# Patient Record
Sex: Female | Born: 1990 | Race: White | Hispanic: No | Marital: Married | Smoking: Never smoker
Health system: Southern US, Community
[De-identification: ages and names within clinical notes are randomized; demographics above are authoritative.]

## PROBLEM LIST (undated history)

## (undated) DIAGNOSIS — S0230XA Fracture of orbital floor, unspecified side, initial encounter for closed fracture: Secondary | ICD-10-CM

## (undated) DIAGNOSIS — B2790 Infectious mononucleosis, unspecified without complication: Secondary | ICD-10-CM

## (undated) DIAGNOSIS — R011 Cardiac murmur, unspecified: Secondary | ICD-10-CM

## (undated) HISTORY — PX: INNER EAR SURGERY: SHX679

---
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
## 2012-03-17 DIAGNOSIS — S0230XA Fracture of orbital floor, unspecified side, initial encounter for closed fracture: Secondary | ICD-10-CM

## 2012-03-17 HISTORY — DX: Fracture of orbital floor, unspecified side, initial encounter for closed fracture: S02.30XA

## 2014-01-05 ENCOUNTER — Ambulatory Visit (INDEPENDENT_AMBULATORY_CARE_PROVIDER_SITE_OTHER): Payer: BC Managed Care – PPO

## 2014-01-05 VITALS — BP 123/78 | HR 65 | Resp 14 | Ht 67.0 in | Wt 153.0 lb

## 2014-01-05 DIAGNOSIS — S90129A Contusion of unspecified lesser toe(s) without damage to nail, initial encounter: Secondary | ICD-10-CM

## 2014-01-05 DIAGNOSIS — M205X9 Other deformities of toe(s) (acquired), unspecified foot: Secondary | ICD-10-CM

## 2014-01-05 DIAGNOSIS — M2040 Other hammer toe(s) (acquired), unspecified foot: Secondary | ICD-10-CM

## 2014-01-05 NOTE — Progress Notes (Signed)
   Subjective:    Patient ID: Sharon Simon, female    DOB: 1991-01-30, 23 y.o.   MRN: [id number]  HPI Comments: N toe and nail problem L B/L 2nd toes and occasional 3rd toes D couple of years worsening since January 2015 O during soccer runs C toes are contracted and painful, with occasional nail loose A enclosed shoes, exercise T changing shoes  Toe Pain       Review of Systems  All other systems reviewed and are negative.      Objective:   Physical Exam 23 year old white female well-developed well-nourished woman x3 presents at this time with a complaint of recurrent injuries to her second and second and third toes of both feet associated with her soccer playing other activities. The nails sometimes get loose and develop some callus in the distal tuft of the toes. No history of acute injury or trauma. Indicates normal patient wearing shoes are long enough it may be somewhat short. Lower extremity objective findings reveal neurovascular status is intact pedal pulses are palpable epicritic sensation intact mild flexible mallet type deformity the toes are noted no rigid contractures noted x-rays confirm normal contracture without contracture of toes with long second and third toes compared to the hallux. Recommended size your shoes to the second third toes not great toe. Remainder of exam unremarkable orthopedic biomechanical exam unremarkable noncontributory the nails show some slight dystrophy due to history of repeated contusions and likely subungual hematomas allowing the nails to followup at times      Assessment & Plan:  Assessment contusion of toes chronic injury to the sports activities as well as mild mallet toe deformities. Recommended longer shoes proper sizing allow for thumbsin the end of the toe box. Also recommended and dispensed and tubercle pads and recommended an OTC orthotic to help stabilize the foot and maintain the toes in a plantigrade position. Patient does  have some slight promontory changes of the foot note noted clinically and radiographically would benefit from our support attitude or athletic and soccer cleats. Followup in the future on an as-needed basis if symptoms persist or exacerbate. However patient is advised especially with aggressive and competitive sports activities such as playing soccer in college collegiate level it's not unusual develop calluses corns and contusions to the toe on a regular basis. Followup as needed  Sharon Simon DPM

## 2014-01-05 NOTE — Patient Instructions (Signed)
Recommended some over-the-counter orthotics or arch supports such as a super feet which can be obtained at [employer]  WEARING INSTRUCTIONS FOR ORTHOTICS    Don't expect to be comfortable wearing your orthotic devices for the first time.  Like eyeglasses, you may be aware of them as time passes, they will not be uncomfortable and you will enjoy wearing them.  FOLLOW THESE INSTRUCTIONS EXACTLY!  1. Wear your orthotic devices for:       Not more than 1 hour the first day.       Not more than 2 hours the second day.       Not more than 3 hours the third day and so on.        Or wear them for as long as they feel comfortable.       If you experience discomfort in your feet or legs take them out.  When feet & legs feel       better, put them back in.  You do need to be consistent and wear them a little        everyday. 2.   If at any time the orthotic devices become acutely uncomfortable before the       time for that particular day, STOP WEARING THEM. 3.   On the next day, do not increase the wearing time. 4.   Subsequently, increase the wearing time by 15-30 minutes only if comfortable to do       so. 5.   You will be seen by your doctor about 2-4 weeks after you receive your orthotic       devices, at which time you will probably be wearing your devices comfortably        for about 8 hours or more a day. 6.   Some patients occasionally report mild aches or discomfort in other parts of the of       body such as the knees, hips or back after 3 or 4 consecutive hours of wear.  If this       is the case with you, do not extend your wearing time.  Instead, cut it back an hour or       two.  In all likelihood, these symptoms will disappear in a short period of time as your       body posture realigns itself and functions more efficiently. 7.   It is possible that your orthotic device may require some small changes or adjustment       to improve their function or make them more comfortable.    This is usually not done       before one to three months have elapsed.  These adjustments are made in        accordance with the changed position your feet are assuming as a result of       improved biomechanical function. 8.   In women's shoes, it's not unusual for your heel to slip out of the shoe, particularly if       they are step-in-shoes.  If this is the case, try other shoes or other styles.  Try to       purchase shoes which have deeper heal seats or higher heel counters. 9.   Squeaking of orthotics devices in the shoes is due to the movement of the devices       when they are functioning normally.  To eliminate squeaking, simply dust some  baby powder into your shoes before inserting the devices.  If this does not work,        apply soap or wax to the edges of the orthotic devices or put a tissue into the shoes. 10. It is important that you follow these directions explicitly.  Failure to do so will simply       prolong the adjustment period or create problems which are easily avoided.  It makes       no difference if you are wearing your orthotic devices for only a few hours after        several months, so long as you are wearing them comfortably for those hours. 11. If you have any questions or complaints, contact our office.  We have no way of       knowing about your problems unless you tell us.  If we do not hear from you, we will       assume that you are proceeding well.

## 2014-05-15 ENCOUNTER — Emergency Department (HOSPITAL_COMMUNITY)
Admission: EM | Admit: 2014-05-15 | Discharge: 2014-05-15 | Disposition: A | Discharge status: Home / Self Care | Payer: BC Managed Care – PPO | Attending: Emergency Medicine | Admitting: Emergency Medicine

## 2014-05-15 ENCOUNTER — Encounter (HOSPITAL_COMMUNITY): Payer: Self-pay | Admitting: Emergency Medicine

## 2014-05-15 DIAGNOSIS — R531 Weakness: Secondary | ICD-10-CM | POA: Insufficient documentation

## 2014-05-15 DIAGNOSIS — M791 Myalgia: Secondary | ICD-10-CM | POA: Insufficient documentation

## 2014-05-15 DIAGNOSIS — H9209 Otalgia, unspecified ear: Secondary | ICD-10-CM | POA: Insufficient documentation

## 2014-05-15 DIAGNOSIS — Z79899 Other long term (current) drug therapy: Secondary | ICD-10-CM | POA: Insufficient documentation

## 2014-05-15 DIAGNOSIS — Z8619 Personal history of other infectious and parasitic diseases: Secondary | ICD-10-CM | POA: Insufficient documentation

## 2014-05-15 DIAGNOSIS — R011 Cardiac murmur, unspecified: Secondary | ICD-10-CM | POA: Insufficient documentation

## 2014-05-15 DIAGNOSIS — Z792 Long term (current) use of antibiotics: Secondary | ICD-10-CM | POA: Diagnosis not present

## 2014-05-15 DIAGNOSIS — J029 Acute pharyngitis, unspecified: Secondary | ICD-10-CM

## 2014-05-15 HISTORY — DX: Infectious mononucleosis, unspecified without complication: B27.90

## 2014-05-15 HISTORY — DX: Cardiac murmur, unspecified: R01.1

## 2014-05-15 LAB — CBC WITH DIFFERENTIAL/PLATELET
Basophils Absolute: 0.1 10*3/uL (ref 0.0–0.1)
Basophils Relative: 0 % (ref 0–1)
Eosinophils Absolute: 0.1 10*3/uL (ref 0.0–0.7)
Eosinophils Relative: 1 % (ref 0–5)
HCT: 36.2 % (ref 36.0–46.0)
Hemoglobin: 12.5 g/dL (ref 12.0–15.0)
LYMPHS ABS: 2.7 10*3/uL (ref 0.7–4.0)
Lymphocytes Relative: 22 % (ref 12–46)
MCH: 31.3 pg (ref 26.0–34.0)
MCHC: 34.5 g/dL (ref 30.0–36.0)
MCV: 90.7 fL (ref 78.0–100.0)
Monocytes Absolute: 1.3 10*3/uL — ABNORMAL HIGH (ref 0.1–1.0)
Monocytes Relative: 10 % (ref 3–12)
NEUTROS ABS: 8.1 10*3/uL — AB (ref 1.7–7.7)
Neutrophils Relative %: 67 % (ref 43–77)
Platelets: 262 10*3/uL (ref 150–400)
RBC: 3.99 MIL/uL (ref 3.87–5.11)
RDW: 12.5 % (ref 11.5–15.5)
WBC: 12.2 10*3/uL — AB (ref 4.0–10.5)

## 2014-05-15 LAB — I-STAT CHEM 8, ED
BUN: 9 mg/dL (ref 6–23)
CREATININE: 0.8 mg/dL (ref 0.50–1.10)
Calcium, Ion: 1.14 mmol/L (ref 1.12–1.23)
Chloride: 102 mEq/L (ref 96–112)
GLUCOSE: 96 mg/dL (ref 70–99)
HCT: 40 % (ref 36.0–46.0)
HEMOGLOBIN: 13.6 g/dL (ref 12.0–15.0)
POTASSIUM: 3.8 meq/L (ref 3.7–5.3)
Sodium: 138 mEq/L (ref 137–147)
TCO2: 21 mmol/L (ref 0–100)

## 2014-05-15 LAB — MONONUCLEOSIS SCREEN: MONO SCREEN: NEGATIVE

## 2014-05-15 LAB — RAPID STREP SCREEN (MED CTR MEBANE ONLY): STREPTOCOCCUS, GROUP A SCREEN (DIRECT): NEGATIVE

## 2014-05-15 MED ORDER — ONDANSETRON 4 MG PO TBDP: 4.0000 mg | ORAL_TABLET | Freq: Three times a day (TID) | ORAL | Status: DC | PRN

## 2014-05-15 MED ORDER — ONDANSETRON HCL 4 MG/2ML IJ SOLN
4.0000 mg | Freq: Once | INTRAMUSCULAR | Status: AC
  Administered 2014-05-15: 4 mg via INTRAVENOUS
  Filled 2014-05-15: qty 2

## 2014-05-15 MED ORDER — CLINDAMYCIN HCL 300 MG PO CAPS
300.0000 mg | ORAL_CAPSULE | Freq: Once | ORAL | Status: AC
  Administered 2014-05-15: 300 mg via ORAL
  Filled 2014-05-15: qty 1

## 2014-05-15 MED ORDER — SODIUM CHLORIDE 0.9 % IV BOLUS (SEPSIS)
1000.0000 mL | Freq: Once | INTRAVENOUS | Status: AC
  Administered 2014-05-15: 1000 mL via INTRAVENOUS

## 2014-05-15 MED ORDER — CLINDAMYCIN HCL 300 MG PO CAPS: 300.0000 mg | ORAL_CAPSULE | Freq: Three times a day (TID) | ORAL | Status: DC

## 2014-05-15 MED ORDER — HYDROCODONE-ACETAMINOPHEN 5-325 MG PO TABS: 2.0000 | ORAL_TABLET | ORAL | Status: DC | PRN

## 2014-05-15 MED ORDER — DEXAMETHASONE SODIUM PHOSPHATE 10 MG/ML IJ SOLN
10.0000 mg | Freq: Once | INTRAMUSCULAR | Status: AC
  Administered 2014-05-15: 10 mg via INTRAVENOUS
  Filled 2014-05-15: qty 1

## 2014-05-15 MED ORDER — MORPHINE SULFATE 4 MG/ML IJ SOLN
4.0000 mg | Freq: Once | INTRAMUSCULAR | Status: AC
  Administered 2014-05-15: 4 mg via INTRAVENOUS
  Filled 2014-05-15: qty 1

## 2014-05-15 MED ORDER — KETOROLAC TROMETHAMINE 30 MG/ML IJ SOLN
30.0000 mg | Freq: Once | INTRAMUSCULAR | Status: AC
  Administered 2014-05-15: 30 mg via INTRAVENOUS
  Filled 2014-05-15: qty 1

## 2014-05-15 NOTE — ED Provider Notes (Signed)
CSN: [id number]     Arrival date & time 05/15/14  0203 History   First MD Initiated Contact with Patient 05/15/14 0208     Chief Complaint  Patient presents with  . Sore Throat  . Otalgia     (Consider location/radiation/quality/duration/timing/severity/associated sxs/prior Treatment) HPI Comments: Patient finished course of antibiotics for + strep several days ago Since has had increased sore throat, difficulty swallowing fevers.  Was give Hydrocodone by mother with no relief  Has been unable to eat for several days due to pain   Patient is a 23 y.o. female presenting with pharyngitis and ear pain. The history is provided by the patient.  Sore Throat This is a recurrent problem. The current episode started in the past 7 days. The problem occurs constantly. The problem has been gradually worsening. Associated symptoms include headaches, myalgias, a sore throat, swollen glands and weakness. Pertinent negatives include no abdominal pain or fever. The symptoms are aggravated by swallowing. She has tried oral narcotics and NSAIDs for the symptoms. The treatment provided no relief.  Otalgia Associated symptoms: headaches and sore throat   Associated symptoms: no abdominal pain, no ear discharge and no fever     Past Medical History  Diagnosis Date  . Mononucleosis   . Heart murmur    Past Surgical History  Procedure Laterality Date  . Inner ear surgery     No family history on file. History  Substance Use Topics  . Smoking status: Never Smoker   . Smokeless tobacco: Not on file  . Alcohol Use: No   OB History    No data available     Review of Systems  Constitutional: Negative for fever.  HENT: Positive for ear pain, sore throat and trouble swallowing. Negative for drooling and ear discharge.   Eyes: Negative for visual disturbance.  Gastrointestinal: Negative for abdominal pain.  Genitourinary: Negative for dysuria.  Musculoskeletal: Positive for myalgias. Negative for  back pain.  Neurological: Positive for weakness and headaches.  All other systems reviewed and are negative.     Allergies  Review of patient's allergies indicates no known allergies.  Home Medications   Prior to Admission medications   Medication Sig Start Date End Date Taking? Authorizing Provider  acetaminophen (TYLENOL) 500 MG tablet Take 1,000 mg by mouth every 6 (six) hours as needed (for pain.).   Yes Historical Provider, MD  ibuprofen (ADVIL,MOTRIN) 200 MG tablet Take 400-600 mg by mouth every 6 (six) hours as needed (for pain).   Yes Historical Provider, MD  lidocaine (XYLOCAINE) 2 % solution Use as directed 10-15 mLs in the mouth or throat every 4 (four) hours as needed. Gargle with to help with pain. 04/30/14  Yes Historical Provider, MD  MONO-LINYAH 0.25-35 MG-MCG tablet 1 tablet. 05/11/14  Yes Historical Provider, MD  Omega-3 Fatty Acids (FISH OIL) 1000 MG CAPS Take 1 capsule by mouth daily.    Yes Historical Provider, MD  Probiotic Product (PROBIOTIC PO) Take 1 tablet by mouth daily.    Yes Historical Provider, MD  valACYclovir (VALTREX) 1000 MG tablet Take 500 mg by mouth daily. 04/07/14  Yes Historical Provider, MD  clindamycin (CLEOCIN) 300 MG capsule Take 1 capsule (300 mg total) by mouth 3 (three) times daily. 05/15/14   Arman FilterGail K Audiel Scheiber, NP  HYDROcodone-acetaminophen (NORCO/VICODIN) 5-325 MG per tablet Take 2 tablets by mouth every 4 (four) hours as needed. 05/15/14   Arman FilterGail K Caleen Taaffe, NP  ondansetron (ZOFRAN ODT) 4 MG disintegrating tablet Take 1 tablet (  4 mg total) by mouth every 8 (eight) hours as needed for nausea or vomiting. 05/15/14   Arman FilterGail K Koty Anctil, NP   BP 140/89 mmHg  Pulse 86  Temp(Src) 98.4 F (36.9 C) (Oral)  Resp 18  Ht 5\' 7"  (1.702 m)  Wt 150 lb (68.04 kg)  BMI 23.49 kg/m2  SpO2 100%  LMP 05/10/2014 Physical Exam  Constitutional: She appears well-developed and well-nourished.  HENT:  Head: Normocephalic.  Right Ear: External ear normal.  Left Ear:  External ear normal.  Mouth/Throat: Oropharynx is clear and moist.  Eyes: Pupils are equal, round, and reactive to light.  Neck: Normal range of motion.  Cardiovascular: Normal rate.   Pulmonary/Chest: Effort normal.  Abdominal: Soft.  Musculoskeletal: Normal range of motion.  Lymphadenopathy:    She has no cervical adenopathy.  Neurological: She is alert.  Skin: Skin is warm and dry. No rash noted.  Nursing note and vitals reviewed.   ED Course  Procedures (including critical care time) Labs Review Labs Reviewed  CBC WITH DIFFERENTIAL - Abnormal; Notable for the following:    WBC 12.2 (*)    Neutro Abs 8.1 (*)    Monocytes Absolute 1.3 (*)    All other components within normal limits  RAPID STREP SCREEN  CULTURE, GROUP A STREP  MONONUCLEOSIS SCREEN  I-STAT CHEM 8, ED    Imaging Review No results found.   EKG Interpretation None      MDM  Discussed with patient and family treatment plan of IV decadron here than PO Clindamycin and Zofran  for home use   Has appointment with PCP on Monday  Final diagnoses:  Pharyngitis         Arman FilterGail K Earlena Werst, NP 05/15/14 0242  Arman FilterGail K Annasophia Crocker, NP 05/15/14 [id number]  Arman FilterGail K Maeve Debord, NP 05/15/14 0510  Loren Raceravid Yelverton, MD 05/16/14 [phone]

## 2014-05-15 NOTE — ED Notes (Signed)
Pt presents with L ear pain, sore throat. Pt states she completed 10 regiment of PCN after testing positive for strep, returned to min clinic neg for strep but now feels worse, difficulty eating, painful swallowing, red faced.

## 2014-05-15 NOTE — Discharge Instructions (Signed)
Today blood work showed a slight increase in WBC to 12.8, negative strep and Mono spot  You were given IV fuids, IV Decadron Zofran , Morphin e and Toradol for symptom relief You have been sent home with prescriptions for Zofran and Clindamycin   Please take these as directed Keep follow up appointment with your PCP on Monday Try to stay hydrated, eat a high protein diet get plenty of rest

## 2014-05-17 LAB — CULTURE, GROUP A STREP

## 2014-06-03 HISTORY — PX: TONSILLECTOMY: SHX5217

## 2014-06-28 ENCOUNTER — Encounter (HOSPITAL_COMMUNITY): Payer: Self-pay | Admitting: Emergency Medicine

## 2014-06-28 ENCOUNTER — Emergency Department (HOSPITAL_COMMUNITY)
Admission: EM | Admit: 2014-06-28 | Discharge: 2014-06-28 | Disposition: A | Discharge status: Home / Self Care | Payer: BLUE CROSS/BLUE SHIELD | Attending: Emergency Medicine | Admitting: Emergency Medicine

## 2014-06-28 DIAGNOSIS — Z79899 Other long term (current) drug therapy: Secondary | ICD-10-CM | POA: Diagnosis not present

## 2014-06-28 DIAGNOSIS — Z792 Long term (current) use of antibiotics: Secondary | ICD-10-CM | POA: Diagnosis not present

## 2014-06-28 DIAGNOSIS — R011 Cardiac murmur, unspecified: Secondary | ICD-10-CM | POA: Insufficient documentation

## 2014-06-28 DIAGNOSIS — R042 Hemoptysis: Secondary | ICD-10-CM | POA: Diagnosis present

## 2014-06-28 DIAGNOSIS — Z8619 Personal history of other infectious and parasitic diseases: Secondary | ICD-10-CM | POA: Diagnosis not present

## 2014-06-28 DIAGNOSIS — J95830 Postprocedural hemorrhage and hematoma of a respiratory system organ or structure following a respiratory system procedure: Secondary | ICD-10-CM | POA: Diagnosis not present

## 2014-06-28 DIAGNOSIS — Y838 Other surgical procedures as the cause of abnormal reaction of the patient, or of later complication, without mention of misadventure at the time of the procedure: Secondary | ICD-10-CM | POA: Insufficient documentation

## 2014-06-28 DIAGNOSIS — J358 Other chronic diseases of tonsils and adenoids: Secondary | ICD-10-CM

## 2014-06-28 NOTE — ED Notes (Signed)
Pt had tonsillectomy on Thursday, spitting up blood, family states she spat up about 3-4 tablespoons of blood, was told to come in for more than 2 tablespoons of blood. Family states patient appears pale. Patient appears to be in control of secretions.

## 2014-06-28 NOTE — ED Provider Notes (Signed)
CSN: [id number]     Arrival date & time 06/28/14  0707 History   First MD Initiated Contact with Patient 06/28/14 [phone]     No chief complaint on file.    (Consider location/radiation/quality/duration/timing/severity/associated sxs/prior Treatment) HPI Comments: Patient here complaining of spitting up blood characterized as bright red approximately 3 tablespoons. Patient had tonsillectomy 5 days ago. Bleeding has since subsided. She has had some trouble swallowing but can control secretions. Has been using her pain medications properly. Did not use any treatment prior to arrival. Nothing made her symptoms better or worse.  The history is provided by the patient and a parent.    Past Medical History  Diagnosis Date  . Mononucleosis   . Heart murmur    Past Surgical History  Procedure Laterality Date  . Inner ear surgery     History reviewed. No pertinent family history. History  Substance Use Topics  . Smoking status: Never Smoker   . Smokeless tobacco: Not on file  . Alcohol Use: No   OB History    No data available     Review of Systems  All other systems reviewed and are negative.     Allergies  Review of patient's allergies indicates no known allergies.  Home Medications   Prior to Admission medications   Medication Sig Start Date End Date Taking? Authorizing Provider  acetaminophen (TYLENOL) 500 MG tablet Take 1,000 mg by mouth every 6 (six) hours as needed (for pain.).    Historical Provider, MD  clindamycin (CLEOCIN) 300 MG capsule Take 1 capsule (300 mg total) by mouth 3 (three) times daily. 05/15/14   Arman FilterGail K Schulz, NP  HYDROcodone-acetaminophen (NORCO/VICODIN) 5-325 MG per tablet Take 2 tablets by mouth every 4 (four) hours as needed. 05/15/14   Arman FilterGail K Schulz, NP  ibuprofen (ADVIL,MOTRIN) 200 MG tablet Take 400-600 mg by mouth every 6 (six) hours as needed (for pain).    Historical Provider, MD  lidocaine (XYLOCAINE) 2 % solution Use as directed 10-15 mLs in  the mouth or throat every 4 (four) hours as needed. Gargle with to help with pain. 04/30/14   Historical Provider, MD  MONO-LINYAH 0.25-35 MG-MCG tablet 1 tablet. 05/11/14   Historical Provider, MD  Omega-3 Fatty Acids (FISH OIL) 1000 MG CAPS Take 1 capsule by mouth daily.     Historical Provider, MD  ondansetron (ZOFRAN ODT) 4 MG disintegrating tablet Take 1 tablet (4 mg total) by mouth every 8 (eight) hours as needed for nausea or vomiting. 05/15/14   Arman FilterGail K Schulz, NP  Probiotic Product (PROBIOTIC PO) Take 1 tablet by mouth daily.     Historical Provider, MD  valACYclovir (VALTREX) 1000 MG tablet Take 500 mg by mouth daily. 04/07/14   Historical Provider, MD   BP 102/64 mmHg  Pulse 79  Resp 18  SpO2 100% Physical Exam  Constitutional: She is oriented to person, place, and time. She appears well-developed and well-nourished.  Non-toxic appearance. No distress.  HENT:  Head: Normocephalic and atraumatic.  Mouth/Throat:    Eyes: Conjunctivae, EOM and lids are normal. Pupils are equal, round, and reactive to light.  Neck: Normal range of motion. Neck supple. No tracheal deviation present. No thyroid mass present.  Cardiovascular: Normal rate, regular rhythm and normal heart sounds.  Exam reveals no gallop.   No murmur heard. Pulmonary/Chest: Effort normal and breath sounds normal. No stridor. No respiratory distress. She has no decreased breath sounds. She has no wheezes. She has no rhonchi. She has  no rales.  Abdominal: Soft. Normal appearance and bowel sounds are normal. She exhibits no distension. There is no tenderness. There is no rebound and no CVA tenderness.  Musculoskeletal: Normal range of motion. She exhibits no edema or tenderness.  Neurological: She is alert and oriented to person, place, and time. She has normal strength. No cranial nerve deficit or sensory deficit. GCS eye subscore is 4. GCS verbal subscore is 5. GCS motor subscore is 6.  Skin: Skin is warm and dry. No abrasion  and no rash noted.  Psychiatric: She has a normal mood and affect. Her speech is normal and behavior is normal.  Nursing note and vitals reviewed.   ED Course  Procedures (including critical care time) Labs Review Labs Reviewed - No data to display  Imaging Review No results found.   EKG Interpretation None      MDM   Final diagnoses:  Tonsillar bleed   patient offered IV fluids here in she has deferred. No active bleeding noted at this time. Patient to follow-up with her ENT doctor    Toy Baker, MD 06/28/14 [phone]

## 2014-06-28 NOTE — Discharge Instructions (Signed)
Follow-up with your ENT doctor today. Return here for any persistent bleeding

## 2017-04-03 ENCOUNTER — Ambulatory Visit (INDEPENDENT_AMBULATORY_CARE_PROVIDER_SITE_OTHER): Payer: BLUE CROSS/BLUE SHIELD

## 2017-04-03 ENCOUNTER — Encounter (INDEPENDENT_AMBULATORY_CARE_PROVIDER_SITE_OTHER): Payer: Self-pay | Admitting: Surgery

## 2017-04-03 ENCOUNTER — Ambulatory Visit (INDEPENDENT_AMBULATORY_CARE_PROVIDER_SITE_OTHER): Payer: BLUE CROSS/BLUE SHIELD | Admitting: Surgery

## 2017-04-03 VITALS — BP 122/78 | HR 73 | Ht 66.0 in | Wt 152.0 lb

## 2017-04-03 DIAGNOSIS — M25551 Pain in right hip: Secondary | ICD-10-CM | POA: Diagnosis not present

## 2017-04-03 DIAGNOSIS — M533 Sacrococcygeal disorders, not elsewhere classified: Secondary | ICD-10-CM

## 2017-04-03 DIAGNOSIS — M545 Low back pain: Secondary | ICD-10-CM | POA: Diagnosis not present

## 2017-04-03 DIAGNOSIS — G8929 Other chronic pain: Secondary | ICD-10-CM

## 2017-04-03 MED ORDER — METHYLPREDNISOLONE 4 MG PO TABS: ORAL_TABLET | ORAL | 0 refills | Status: DC

## 2017-04-03 NOTE — Progress Notes (Signed)
 Office Visit Note   Patient: Sharon Simon           Date of Birth: 02/05/1991           MRN: [id number] Visit Date: 04/03/2017              Requested by: No referring provider defined for this encounter. PCP: Patient, No Pcp Per   Assessment & Plan: Visit Diagnoses:  1. Right low back pain, unspecified chronicity, with sciatica presence unspecified   2. Chronic right SI joint pain   3. Lateral pain of right hip     Plan: Patient was given prescription for Medrol Dosepak 6 day taper to be taken as directed see if this helps with all areas addressed today. She did appear to have some degenerative changes of the right SI joint and with the symptoms that she has been having over the last 3 months I do recommend getting blood work to check CBC and arthritis panel. She'll also go to a couple of visits of formal PT for lumbar protocol and also to be given home exercises to include hamstring and IT band stretching. Follow-up in a couple weeks for recheck and will review labs at that time. We'll make further decision on imaging studies depending upon her symptoms. All questions answered.  Follow-Up Instructions: Return in about 2 weeks (around 04/17/2017) for With .   Orders:  Orders Placed This Encounter  Procedures  . XR Lumbar Spine 2-3 Views  . XR Pelvis 1-2 Views  . CBC  . Rheumatoid Factor  . Antinuclear Antib (ANA)  . Uric acid  . Sed Rate (ESR)   Meds ordered this encounter  Medications  . DISCONTD: methylPREDNISolone (MEDROL) 4 MG tablet    Sig: 6 day taper to be taken as directed    Dispense:  21 tablet    Refill:  0  . methylPREDNISolone (MEDROL) 4 MG tablet    Sig: 6 day taper to be taken as directed    Dispense:  21 tablet    Refill:  0      Procedures: No procedures performed   Clinical Data: No additional findings.   Subjective: Chief Complaint  Patient presents with  . Lower Back - Pain    HPI Referral white female is new patient the  office comes in with complaints of right-sided low back and buttock pain. States that pain as been off and on 3 months but worse over the last day or so. No specific injury that she can recall. 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 Nothing on the left side. No complaints of radiating pain down the right leg or numbness and tingling. Discomfort aggravated with ambulation, getting up from a seated position, bending. Better with standing still or lying flat. Does have some lateral hip pain and lying on her right side. Has used ibuprofen and Biofreeze without any improvement. No previous problems for onset. No other specific joint pain. No personal history of inflammatory arthropathy.  Review of Systems No current cardiopulmonary GI GU issues.  Objective: Vital Signs: BP 122/78 (BP Location: Left Arm, Patient Position: Sitting)   Pulse 73   Ht 5' 6" (1.676 m)   Wt 152 lb (68.9 kg)   BMI 24.53 kg/m   Physical Exam  Constitutional: She is oriented to person, place, and time. She appears well-developed and well-nourished. No distress.  HENT:  Head:   Normocephalic and atraumatic.  Eyes: Pupils are equal, round, and reactive to light. EOM are normal.  Neck: Normal range of motion.  Pulmonary/Chest: No respiratory distress.  Abdominal: She exhibits no distension.  Musculoskeletal:  Exam heel and toe gait normal. She does have some right-sided low back pain with lumbar extension. Lumbar flexion does cause discomfort right low back. No lumbar paraspinal tenderness. She is mild to moderately tender over the right SI joint. Negative on the left side. No sciatic notch tenderness. Negative straight leg raise the patient does have tight bilateral hamstrings. Bilateral hamstrings tendon nontender. Mildly tender over the right hip greater trochanter bursa. Negative on the left side. Mild to moderately positive FABER test  on the right. Negative on the left side. Bilateral calves nontender. Neurovascularly intact. No focal motor deficits.  Neurological: She is alert and oriented to person, place, and time.  Skin: Skin is warm.  Psychiatric: She has a normal mood and affect.    Ortho Exam  Specialty Comments:  No specialty comments available.  Imaging: Xr Lumbar Spine 2-3 Views  Result Date: 04/03/2017 X-rays lumbar spine show disc spaces to be well-maintained. No listhesis. No acute finding.  Xr Pelvis 1-2 Views  Result Date: 04/03/2017 AP pelvis and bilateral SI joints show question of mild to moderate degenerative changes of the lower right SI joint. No acute finding.    PMFS History: There are no active problems to display for this patient.  Past Medical History:  Diagnosis Date  . Heart murmur   . Mononucleosis     No family history on file.  Past Surgical History:  Procedure Laterality Date  . INNER EAR SURGERY    . TONSILLECTOMY  06/2014   Social History   Occupational History  . Not on file.   Social History Main Topics  . Smoking status: Never Smoker  . Smokeless tobacco: Never Used  . Alcohol use No  . Drug use: No  . Sexual activity: Not on file

## 2017-04-04 LAB — CBC
HEMATOCRIT: 40.8 % (ref 35.0–45.0)
HEMOGLOBIN: 14 g/dL (ref 11.7–15.5)
MCH: 31.7 pg (ref 27.0–33.0)
MCHC: 34.3 g/dL (ref 32.0–36.0)
MCV: 92.5 fL (ref 80.0–100.0)
MPV: 11.3 fL (ref 7.5–12.5)
Platelets: 297 10*3/uL (ref 140–400)
RBC: 4.41 10*6/uL (ref 3.80–5.10)
RDW: 12 % (ref 11.0–15.0)
WBC: 6.7 10*3/uL (ref 3.8–10.8)

## 2017-04-04 LAB — ANA: ANA: NEGATIVE

## 2017-04-04 LAB — SEDIMENTATION RATE: SED RATE: 6 mm/h (ref 0–20)

## 2017-04-04 LAB — URIC ACID: URIC ACID, SERUM: 3.6 mg/dL (ref 2.5–7.0)

## 2017-04-04 LAB — RHEUMATOID FACTOR: Rhuematoid fact SerPl-aCnc: <14 IU/mL (ref <14)

## 2017-04-16 ENCOUNTER — Ambulatory Visit (INDEPENDENT_AMBULATORY_CARE_PROVIDER_SITE_OTHER): Payer: BLUE CROSS/BLUE SHIELD | Admitting: Surgery

## 2017-04-23 ENCOUNTER — Encounter (INDEPENDENT_AMBULATORY_CARE_PROVIDER_SITE_OTHER): Payer: Self-pay | Admitting: Surgery

## 2017-04-23 ENCOUNTER — Ambulatory Visit (INDEPENDENT_AMBULATORY_CARE_PROVIDER_SITE_OTHER): Payer: BLUE CROSS/BLUE SHIELD | Admitting: Surgery

## 2017-04-23 DIAGNOSIS — G8929 Other chronic pain: Secondary | ICD-10-CM | POA: Diagnosis not present

## 2017-04-23 DIAGNOSIS — M533 Sacrococcygeal disorders, not elsewhere classified: Secondary | ICD-10-CM | POA: Diagnosis not present

## 2017-04-23 NOTE — Progress Notes (Addendum)
   Office Visit Note   Patient: Sharon Simon           Date of Birth: Feb 26, 1991           MRN: [id number] Visit Date: 04/23/2017              Requested by: No referring provider defined for this encounter. PCP: Patient, No Pcp Per   Assessment & Plan: Visit Diagnoses:  1. Chronic right SI joint pain     Plan: Patient doing much better after medication. Advised to avoid activities that may inflame the SI joint. Follow-up the office as needed. If pain returns we may consider trying a diagnostic/therapeutic SI joint injection with Dr. Alvester MorinNewton. All questions answered.  Follow-Up Instructions: Return if symptoms worsen or fail to improve.   Orders:  No orders of the defined types were placed in this encounter.  No orders of the defined types were placed in this encounter.     Procedures: No procedures performed   Clinical Data: No additional findings.   Subjective: Chief Complaint  Patient presents with  . Lower Back - Follow-up    HPI Patient returns. Pain is gone after taking the prednisone taper. Doing well at this point.  Last office visit blood work was done to check CBC and arthritis panel and all of these were within normal limits. Review of Systems No cardiac pulmonary GI GU issues  Objective: Vital Signs: There were no vitals taken for this visit.  Physical Exam  Constitutional: She is oriented to person, place, and time. She appears well-developed. No distress.  HENT:  Head: Normocephalic and atraumatic.  Eyes: EOM are normal. Pupils are equal, round, and reactive to light.  Pulmonary/Chest: No respiratory distress.  Musculoskeletal:  Gait is normal. No pain with lumbar flexion/extension. Nontender over the bilateral SI joints. Negative logroll bilateral hips.  Neurological: She is alert and oriented to person, place, and time.    Ortho Exam  Specialty Comments:  No specialty comments available.  Imaging: No results found.   PMFS  History: There are no active problems to display for this patient.  Past Medical History:  Diagnosis Date  . Heart murmur   . Mononucleosis     History reviewed. No pertinent family history.  Past Surgical History:  Procedure Laterality Date  . INNER EAR SURGERY    . TONSILLECTOMY  06/2014   Social History   Occupational History  . Not on file  Tobacco Use  . Smoking status: Never Smoker  . Smokeless tobacco: Never Used  Substance and Sexual Activity  . Alcohol use: No  . Drug use: No  . Sexual activity: Not on file

## 2018-09-13 ENCOUNTER — Ambulatory Visit (HOSPITAL_COMMUNITY)
Admission: EM | Admit: 2018-09-13 | Discharge: 2018-09-14 | Disposition: A | Discharge status: Home / Self Care | Payer: BLUE CROSS/BLUE SHIELD | Attending: Surgery | Admitting: Surgery

## 2018-09-13 ENCOUNTER — Other Ambulatory Visit: Payer: Self-pay

## 2018-09-13 ENCOUNTER — Emergency Department (HOSPITAL_COMMUNITY): Payer: BLUE CROSS/BLUE SHIELD | Admitting: Certified Registered Nurse Anesthetist

## 2018-09-13 ENCOUNTER — Encounter (HOSPITAL_COMMUNITY): Payer: Self-pay | Admitting: Emergency Medicine

## 2018-09-13 ENCOUNTER — Emergency Department (HOSPITAL_COMMUNITY): Payer: BLUE CROSS/BLUE SHIELD

## 2018-09-13 ENCOUNTER — Encounter (HOSPITAL_COMMUNITY)
Admission: EM | Disposition: A | Discharge status: Home / Self Care | Payer: Self-pay | Source: Home / Self Care | Attending: Emergency Medicine

## 2018-09-13 DIAGNOSIS — K3580 Unspecified acute appendicitis: Secondary | ICD-10-CM | POA: Diagnosis present

## 2018-09-13 DIAGNOSIS — Z79899 Other long term (current) drug therapy: Secondary | ICD-10-CM | POA: Diagnosis not present

## 2018-09-13 DIAGNOSIS — Z881 Allergy status to other antibiotic agents status: Secondary | ICD-10-CM | POA: Insufficient documentation

## 2018-09-13 DIAGNOSIS — K381 Appendicular concretions: Secondary | ICD-10-CM | POA: Diagnosis not present

## 2018-09-13 HISTORY — PX: LAPAROSCOPIC APPENDECTOMY: SHX408

## 2018-09-13 HISTORY — DX: Fracture of orbital floor, unspecified side, initial encounter for closed fracture: S02.30XA

## 2018-09-13 LAB — COMPREHENSIVE METABOLIC PANEL
ALT: 12 U/L (ref 0–44)
AST: 21 U/L (ref 15–41)
Albumin: 4 g/dL (ref 3.5–5.0)
Alkaline Phosphatase: 44 U/L (ref 38–126)
Anion gap: 10 (ref 5–15)
BUN: 12 mg/dL (ref 6–20)
CO2: 19 mmol/L — ABNORMAL LOW (ref 22–32)
Calcium: 8.8 mg/dL — ABNORMAL LOW (ref 8.9–10.3)
Chloride: 105 mmol/L (ref 98–111)
Creatinine, Ser: 0.77 mg/dL (ref 0.44–1.00)
GFR calc Af Amer: >60 mL/min (ref >60)
GFR calc non Af Amer: >60 mL/min (ref >60)
Glucose, Bld: 124 mg/dL — ABNORMAL HIGH (ref 70–99)
Potassium: 3.8 mmol/L (ref 3.5–5.1)
Sodium: 134 mmol/L — ABNORMAL LOW (ref 135–145)
Total Bilirubin: 0.4 mg/dL (ref 0.3–1.2)
Total Protein: 7.1 g/dL (ref 6.5–8.1)

## 2018-09-13 LAB — CBC WITH DIFFERENTIAL/PLATELET
Abs Immature Granulocytes: 0.02 10*3/uL (ref 0.00–0.07)
Basophils Absolute: 0.1 10*3/uL (ref 0.0–0.1)
Basophils Relative: 1 %
Eosinophils Absolute: 0 10*3/uL (ref 0.0–0.5)
Eosinophils Relative: 0 %
HCT: 38.7 % (ref 36.0–46.0)
Hemoglobin: 12.9 g/dL (ref 12.0–15.0)
Immature Granulocytes: 0 %
Lymphocytes Relative: 16 %
Lymphs Abs: 1.5 10*3/uL (ref 0.7–4.0)
MCH: 31.2 pg (ref 26.0–34.0)
MCHC: 33.3 g/dL (ref 30.0–36.0)
MCV: 93.7 fL (ref 80.0–100.0)
Monocytes Absolute: 0.5 10*3/uL (ref 0.1–1.0)
Monocytes Relative: 5 %
Neutro Abs: 7.8 10*3/uL — ABNORMAL HIGH (ref 1.7–7.7)
Neutrophils Relative %: 78 %
Platelets: 240 10*3/uL (ref 150–400)
RBC: 4.13 MIL/uL (ref 3.87–5.11)
RDW: 12 % (ref 11.5–15.5)
WBC: 9.9 10*3/uL (ref 4.0–10.5)
nRBC: 0 % (ref 0.0–0.2)

## 2018-09-13 LAB — URINALYSIS, ROUTINE W REFLEX MICROSCOPIC
Bilirubin Urine: NEGATIVE
Glucose, UA: NEGATIVE mg/dL
Hgb urine dipstick: NEGATIVE
Ketones, ur: 80 mg/dL — AB
Leukocytes,Ua: NEGATIVE
Nitrite: NEGATIVE
Protein, ur: NEGATIVE mg/dL
Specific Gravity, Urine: >1.046 — ABNORMAL HIGH (ref 1.005–1.030)
pH: 5 (ref 5.0–8.0)

## 2018-09-13 LAB — I-STAT BETA HCG BLOOD, ED (MC, WL, AP ONLY): I-stat hCG, quantitative: <5 m[IU]/mL (ref <5)

## 2018-09-13 LAB — LIPASE, BLOOD: Lipase: 27 U/L (ref 11–51)

## 2018-09-13 SURGERY — APPENDECTOMY, LAPAROSCOPIC
Anesthesia: General | Site: Abdomen

## 2018-09-13 MED ORDER — HYDROMORPHONE HCL 1 MG/ML IJ SOLN
INTRAMUSCULAR | Status: AC
  Filled 2018-09-13: qty 1

## 2018-09-13 MED ORDER — SUGAMMADEX SODIUM 200 MG/2ML IV SOLN
INTRAVENOUS | Status: DC | PRN
  Administered 2018-09-13: 175 mg via INTRAVENOUS

## 2018-09-13 MED ORDER — GABAPENTIN 300 MG PO CAPS: 300.0000 mg | ORAL_CAPSULE | ORAL | Status: DC

## 2018-09-13 MED ORDER — LIDOCAINE 2% (20 MG/ML) 5 ML SYRINGE
INTRAMUSCULAR | Status: AC
  Filled 2018-09-13: qty 5

## 2018-09-13 MED ORDER — KETOROLAC TROMETHAMINE 30 MG/ML IJ SOLN: 30.0000 mg | Freq: Once | INTRAMUSCULAR | Status: DC | PRN

## 2018-09-13 MED ORDER — FENTANYL CITRATE (PF) 100 MCG/2ML IJ SOLN
INTRAMUSCULAR | Status: DC | PRN
  Administered 2018-09-13: 50 ug via INTRAVENOUS
  Administered 2018-09-13: 100 ug via INTRAVENOUS
  Administered 2018-09-13 (×2): 50 ug via INTRAVENOUS

## 2018-09-13 MED ORDER — MAGIC MOUTHWASH
15.0000 mL | Freq: Four times a day (QID) | ORAL | Status: DC | PRN
  Filled 2018-09-13: qty 15

## 2018-09-13 MED ORDER — HYDROMORPHONE HCL 1 MG/ML IJ SOLN: 0.5000 mg | INTRAMUSCULAR | Status: DC | PRN

## 2018-09-13 MED ORDER — PROCHLORPERAZINE EDISYLATE 10 MG/2ML IJ SOLN: 5.0000 mg | Freq: Four times a day (QID) | INTRAMUSCULAR | Status: DC | PRN

## 2018-09-13 MED ORDER — METRONIDAZOLE IN NACL 5-0.79 MG/ML-% IV SOLN
500.0000 mg | Freq: Three times a day (TID) | INTRAVENOUS | Status: DC
  Administered 2018-09-14: 02:00:00 500 mg via INTRAVENOUS
  Filled 2018-09-13 (×2): qty 100

## 2018-09-13 MED ORDER — SIMETHICONE 80 MG PO CHEW
40.0000 mg | CHEWABLE_TABLET | Freq: Four times a day (QID) | ORAL | Status: DC | PRN
  Administered 2018-09-13: 40 mg via ORAL
  Filled 2018-09-13: qty 1

## 2018-09-13 MED ORDER — SCOPOLAMINE 1 MG/3DAYS TD PT72
MEDICATED_PATCH | TRANSDERMAL | Status: DC | PRN
  Administered 2018-09-13: 1 via TRANSDERMAL

## 2018-09-13 MED ORDER — ONDANSETRON HCL 4 MG/2ML IJ SOLN
INTRAMUSCULAR | Status: AC
  Filled 2018-09-13: qty 2

## 2018-09-13 MED ORDER — TRAMADOL HCL 50 MG PO TABS
50.0000 mg | ORAL_TABLET | Freq: Four times a day (QID) | ORAL | Status: DC | PRN
  Administered 2018-09-14: 50 mg via ORAL
  Filled 2018-09-13: qty 1

## 2018-09-13 MED ORDER — CHLORHEXIDINE GLUCONATE CLOTH 2 % EX PADS: 6.0000 | MEDICATED_PAD | Freq: Once | CUTANEOUS | Status: DC

## 2018-09-13 MED ORDER — ACETAMINOPHEN 500 MG PO TABS: 1000.0000 mg | ORAL_TABLET | ORAL | Status: DC

## 2018-09-13 MED ORDER — METRONIDAZOLE IN NACL 5-0.79 MG/ML-% IV SOLN
500.0000 mg | Freq: Once | INTRAVENOUS | Status: AC
  Administered 2018-09-13: 500 mg via INTRAVENOUS
  Filled 2018-09-13: qty 100

## 2018-09-13 MED ORDER — METOPROLOL TARTRATE 5 MG/5ML IV SOLN: 5.0000 mg | Freq: Four times a day (QID) | INTRAVENOUS | Status: DC | PRN

## 2018-09-13 MED ORDER — BUPIVACAINE LIPOSOME 1.3 % IJ SUSP
INTRAMUSCULAR | Status: DC | PRN
  Administered 2018-09-13: 20 mL

## 2018-09-13 MED ORDER — SUCCINYLCHOLINE CHLORIDE 200 MG/10ML IV SOSY
PREFILLED_SYRINGE | INTRAVENOUS | Status: AC
  Filled 2018-09-13: qty 10

## 2018-09-13 MED ORDER — SODIUM CHLORIDE 0.9 % IV SOLN: 2.0000 g | INTRAVENOUS | Status: DC

## 2018-09-13 MED ORDER — DIPHENHYDRAMINE HCL 50 MG/ML IJ SOLN: 12.5000 mg | Freq: Four times a day (QID) | INTRAMUSCULAR | Status: DC | PRN

## 2018-09-13 MED ORDER — SODIUM CHLORIDE 0.9% FLUSH: 3.0000 mL | INTRAVENOUS | Status: DC | PRN

## 2018-09-13 MED ORDER — LIP MEDEX EX OINT
1.0000 "application " | TOPICAL_OINTMENT | Freq: Two times a day (BID) | CUTANEOUS | Status: DC
  Administered 2018-09-13: 1 via TOPICAL
  Filled 2018-09-13: qty 7

## 2018-09-13 MED ORDER — SODIUM CHLORIDE 0.9 % IV SOLN: 250.0000 mL | INTRAVENOUS | Status: DC | PRN

## 2018-09-13 MED ORDER — HYDROMORPHONE HCL 1 MG/ML IJ SOLN
0.2500 mg | INTRAMUSCULAR | Status: DC | PRN
  Administered 2018-09-13: 0.25 mg via INTRAVENOUS
  Administered 2018-09-13: 20:00:00 0.5 mg via INTRAVENOUS

## 2018-09-13 MED ORDER — SUCCINYLCHOLINE CHLORIDE 200 MG/10ML IV SOSY
PREFILLED_SYRINGE | INTRAVENOUS | Status: DC | PRN
  Administered 2018-09-13: 120 mg via INTRAVENOUS

## 2018-09-13 MED ORDER — HYDRALAZINE HCL 20 MG/ML IJ SOLN: 5.0000 mg | INTRAMUSCULAR | Status: DC | PRN

## 2018-09-13 MED ORDER — MIDAZOLAM HCL 2 MG/2ML IJ SOLN
INTRAMUSCULAR | Status: AC
  Filled 2018-09-13: qty 2

## 2018-09-13 MED ORDER — IOHEXOL 300 MG/ML  SOLN
100.0000 mL | Freq: Once | INTRAMUSCULAR | Status: AC | PRN
  Administered 2018-09-13: 16:00:00 100 mL via INTRAVENOUS

## 2018-09-13 MED ORDER — LACTATED RINGERS IV BOLUS: 1000.0000 mL | Freq: Three times a day (TID) | INTRAVENOUS | Status: DC | PRN

## 2018-09-13 MED ORDER — NAPROXEN 500 MG PO TABS: 500.0000 mg | ORAL_TABLET | Freq: Two times a day (BID) | ORAL | 1 refills | Status: AC | PRN

## 2018-09-13 MED ORDER — LACTATED RINGERS IV SOLN: 1000.0000 mL | Freq: Three times a day (TID) | INTRAVENOUS | Status: DC | PRN

## 2018-09-13 MED ORDER — DEXAMETHASONE SODIUM PHOSPHATE 10 MG/ML IJ SOLN
INTRAMUSCULAR | Status: AC
  Filled 2018-09-13: qty 1

## 2018-09-13 MED ORDER — MIDAZOLAM HCL 5 MG/5ML IJ SOLN
INTRAMUSCULAR | Status: DC | PRN
  Administered 2018-09-13: 2 mg via INTRAVENOUS

## 2018-09-13 MED ORDER — BISACODYL 10 MG RE SUPP: 10.0000 mg | Freq: Every day | RECTAL | Status: DC | PRN

## 2018-09-13 MED ORDER — DEXAMETHASONE SODIUM PHOSPHATE 10 MG/ML IJ SOLN
INTRAMUSCULAR | Status: DC | PRN
  Administered 2018-09-13: 10 mg via INTRAVENOUS

## 2018-09-13 MED ORDER — BUPIVACAINE LIPOSOME 1.3 % IJ SUSP
20.0000 mL | INTRAMUSCULAR | Status: AC
  Filled 2018-09-13: qty 20

## 2018-09-13 MED ORDER — ENOXAPARIN SODIUM 40 MG/0.4ML ~~LOC~~ SOLN
40.0000 mg | SUBCUTANEOUS | Status: DC
  Administered 2018-09-14: 40 mg via SUBCUTANEOUS
  Filled 2018-09-13: qty 0.4

## 2018-09-13 MED ORDER — ONDANSETRON HCL 4 MG/2ML IJ SOLN
INTRAMUSCULAR | Status: DC | PRN
  Administered 2018-09-13: 4 mg via INTRAVENOUS

## 2018-09-13 MED ORDER — ROCURONIUM BROMIDE 10 MG/ML (PF) SYRINGE
PREFILLED_SYRINGE | INTRAVENOUS | Status: DC | PRN
  Administered 2018-09-13: 5 mg via INTRAVENOUS
  Administered 2018-09-13: 35 mg via INTRAVENOUS

## 2018-09-13 MED ORDER — BUPIVACAINE-EPINEPHRINE (PF) 0.25% -1:200000 IJ SOLN
INTRAMUSCULAR | Status: AC
  Filled 2018-09-13: qty 30

## 2018-09-13 MED ORDER — LIDOCAINE 2% (20 MG/ML) 5 ML SYRINGE
INTRAMUSCULAR | Status: DC | PRN
  Administered 2018-09-13: 100 mg via INTRAVENOUS

## 2018-09-13 MED ORDER — SODIUM CHLORIDE 0.9% FLUSH: 3.0000 mL | Freq: Two times a day (BID) | INTRAVENOUS | Status: DC

## 2018-09-13 MED ORDER — DIPHENHYDRAMINE HCL 12.5 MG/5ML PO ELIX: 12.5000 mg | ORAL_SOLUTION | Freq: Four times a day (QID) | ORAL | Status: DC | PRN

## 2018-09-13 MED ORDER — MEPERIDINE HCL 50 MG/ML IJ SOLN: 6.2500 mg | INTRAMUSCULAR | Status: DC | PRN

## 2018-09-13 MED ORDER — SODIUM CHLORIDE 0.9 % IV SOLN
2.0000 g | Freq: Once | INTRAVENOUS | Status: AC
  Administered 2018-09-13: 17:00:00 2 g via INTRAVENOUS
  Filled 2018-09-13: qty 20

## 2018-09-13 MED ORDER — SODIUM CHLORIDE 0.9 % IV SOLN
INTRAVENOUS | Status: DC
  Administered 2018-09-13 (×2): via INTRAVENOUS

## 2018-09-13 MED ORDER — BUPIVACAINE-EPINEPHRINE 0.25% -1:200000 IJ SOLN
INTRAMUSCULAR | Status: DC | PRN
  Administered 2018-09-13: 30 mL

## 2018-09-13 MED ORDER — 0.9 % SODIUM CHLORIDE (POUR BTL) OPTIME
TOPICAL | Status: DC | PRN
  Administered 2018-09-13: 1000 mL

## 2018-09-13 MED ORDER — ROCURONIUM BROMIDE 10 MG/ML (PF) SYRINGE
PREFILLED_SYRINGE | INTRAVENOUS | Status: AC
  Filled 2018-09-13: qty 10

## 2018-09-13 MED ORDER — HYDROMORPHONE HCL 1 MG/ML IJ SOLN
1.0000 mg | Freq: Once | INTRAMUSCULAR | Status: AC
  Administered 2018-09-13: 16:00:00 1 mg via INTRAVENOUS
  Filled 2018-09-13: qty 1

## 2018-09-13 MED ORDER — ONDANSETRON HCL 4 MG/2ML IJ SOLN: 4.0000 mg | Freq: Four times a day (QID) | INTRAMUSCULAR | Status: DC | PRN

## 2018-09-13 MED ORDER — FENTANYL CITRATE (PF) 250 MCG/5ML IJ SOLN
INTRAMUSCULAR | Status: AC
  Filled 2018-09-13: qty 5

## 2018-09-13 MED ORDER — PROMETHAZINE HCL 25 MG/ML IJ SOLN: 6.2500 mg | INTRAMUSCULAR | Status: DC | PRN

## 2018-09-13 MED ORDER — POLYETHYLENE GLYCOL 3350 17 G PO PACK: 17.0000 g | PACK | Freq: Every day | ORAL | Status: DC | PRN

## 2018-09-13 MED ORDER — ONDANSETRON 4 MG PO TBDP: 4.0000 mg | ORAL_TABLET | Freq: Four times a day (QID) | ORAL | Status: DC | PRN

## 2018-09-13 MED ORDER — ONDANSETRON HCL 4 MG/2ML IJ SOLN
4.0000 mg | Freq: Once | INTRAMUSCULAR | Status: AC
  Administered 2018-09-13: 15:00:00 4 mg via INTRAVENOUS
  Filled 2018-09-13: qty 2

## 2018-09-13 MED ORDER — ACETAMINOPHEN 500 MG PO TABS
1000.0000 mg | ORAL_TABLET | Freq: Three times a day (TID) | ORAL | Status: DC
  Administered 2018-09-13 – 2018-09-14 (×2): 1000 mg via ORAL
  Filled 2018-09-13 (×2): qty 2

## 2018-09-13 MED ORDER — ZOLPIDEM TARTRATE 5 MG PO TABS: 5.0000 mg | ORAL_TABLET | Freq: Every evening | ORAL | Status: DC | PRN

## 2018-09-13 MED ORDER — NAPROXEN 500 MG PO TABS: 500.0000 mg | ORAL_TABLET | Freq: Two times a day (BID) | ORAL | Status: DC | PRN

## 2018-09-13 MED ORDER — SODIUM CHLORIDE 0.9 % IV SOLN
INTRAVENOUS | Status: DC
  Administered 2018-09-13: 23:00:00 via INTRAVENOUS

## 2018-09-13 MED ORDER — TRAMADOL HCL 50 MG PO TABS: 50.0000 mg | ORAL_TABLET | Freq: Four times a day (QID) | ORAL | 0 refills | Status: AC | PRN

## 2018-09-13 MED ORDER — PROPOFOL 10 MG/ML IV BOLUS
INTRAVENOUS | Status: AC
  Filled 2018-09-13: qty 20

## 2018-09-13 MED ORDER — PROPOFOL 10 MG/ML IV BOLUS
INTRAVENOUS | Status: DC | PRN
  Administered 2018-09-13: 180 mg via INTRAVENOUS

## 2018-09-13 MED ORDER — PROCHLORPERAZINE MALEATE 10 MG PO TABS: 10.0000 mg | ORAL_TABLET | Freq: Four times a day (QID) | ORAL | Status: DC | PRN

## 2018-09-13 MED ORDER — MORPHINE SULFATE (PF) 4 MG/ML IV SOLN
4.0000 mg | Freq: Once | INTRAVENOUS | Status: AC
  Administered 2018-09-13: 15:00:00 4 mg via INTRAVENOUS
  Filled 2018-09-13: qty 1

## 2018-09-13 MED ORDER — METRONIDAZOLE IN NACL 5-0.79 MG/ML-% IV SOLN: 500.0000 mg | INTRAVENOUS | Status: DC

## 2018-09-13 MED ORDER — SODIUM CHLORIDE (PF) 0.9 % IJ SOLN
INTRAMUSCULAR | Status: AC
  Filled 2018-09-13: qty 50

## 2018-09-13 MED ORDER — OXYCODONE HCL 5 MG PO TABS
5.0000 mg | ORAL_TABLET | ORAL | Status: DC | PRN
  Administered 2018-09-13: 23:00:00 10 mg via ORAL
  Filled 2018-09-13: qty 2

## 2018-09-13 MED ORDER — GABAPENTIN 300 MG PO CAPS
300.0000 mg | ORAL_CAPSULE | Freq: Two times a day (BID) | ORAL | Status: DC
  Administered 2018-09-13 – 2018-09-14 (×2): 300 mg via ORAL
  Filled 2018-09-13 (×2): qty 1

## 2018-09-13 MED ORDER — SCOPOLAMINE 1 MG/3DAYS TD PT72
MEDICATED_PATCH | TRANSDERMAL | Status: AC
  Filled 2018-09-13: qty 1

## 2018-09-13 MED ORDER — METHOCARBAMOL 500 MG PO TABS: 750.0000 mg | ORAL_TABLET | Freq: Four times a day (QID) | ORAL | Status: DC | PRN

## 2018-09-13 SURGICAL SUPPLY — 49 items
APL PRP STRL LF DISP 70% ISPRP (MISCELLANEOUS) ×1
APPLIER CLIP 5 13 M/L LIGAMAX5 (MISCELLANEOUS)
APPLIER CLIP ROT 10 11.4 M/L (STAPLE)
APR CLP MED LRG 11.4X10 (STAPLE)
APR CLP MED LRG 5 ANG JAW (MISCELLANEOUS)
BAG SPEC RTRVL LRG 6X4 10 (ENDOMECHANICALS) ×1
CABLE HIGH FREQUENCY MONO STRZ (ELECTRODE) ×2 IMPLANT
CHLORAPREP W/TINT 26 (MISCELLANEOUS) ×2 IMPLANT
CLIP APPLIE 5 13 M/L LIGAMAX5 (MISCELLANEOUS) IMPLANT
CLIP APPLIE ROT 10 11.4 M/L (STAPLE) IMPLANT
COVER SURGICAL LIGHT HANDLE (MISCELLANEOUS) ×2 IMPLANT
COVER WAND RF STERILE (DRAPES) IMPLANT
CUTTER FLEX LINEAR 45M (STAPLE) ×2 IMPLANT
DECANTER SPIKE VIAL GLASS SM (MISCELLANEOUS) ×2 IMPLANT
DEVICE TROCAR PUNCTURE CLOSURE (ENDOMECHANICALS) IMPLANT
DRAPE LAPAROSCOPIC ABDOMINAL (DRAPES) ×2 IMPLANT
DRAPE WARM FLUID 44X44 (DRAPE) ×2 IMPLANT
DRSG TEGADERM 2-3/8X2-3/4 SM (GAUZE/BANDAGES/DRESSINGS) ×4 IMPLANT
DRSG TEGADERM 4X4.75 (GAUZE/BANDAGES/DRESSINGS) ×2 IMPLANT
ELECT REM PT RETURN 15FT ADLT (MISCELLANEOUS) ×2 IMPLANT
ENDOLOOP SUT PDS II  0 18 (SUTURE) ×1
ENDOLOOP SUT PDS II 0 18 (SUTURE) IMPLANT
GAUZE SPONGE 2X2 8PLY STRL LF (GAUZE/BANDAGES/DRESSINGS) ×1 IMPLANT
GLOVE ECLIPSE 8.0 STRL XLNG CF (GLOVE) ×2 IMPLANT
GLOVE INDICATOR 8.0 STRL GRN (GLOVE) ×2 IMPLANT
GOWN STRL REUS W/TWL XL LVL3 (GOWN DISPOSABLE) ×4 IMPLANT
IRRIG SUCT STRYKERFLOW 2 WTIP (MISCELLANEOUS) ×2
IRRIGATION SUCT STRKRFLW 2 WTP (MISCELLANEOUS) ×1 IMPLANT
KIT BASIN OR (CUSTOM PROCEDURE TRAY) ×2 IMPLANT
KIT TURNOVER KIT A (KITS) IMPLANT
PAD POSITIONING PINK XL (MISCELLANEOUS) ×2 IMPLANT
POUCH SPECIMEN RETRIEVAL 10MM (ENDOMECHANICALS) ×2 IMPLANT
RELOAD 45 VASCULAR/THIN (ENDOMECHANICALS) IMPLANT
RELOAD STAPLE 45 3.5 BLU ETS (ENDOMECHANICALS) IMPLANT
RELOAD STAPLE TA45 3.5 REG BLU (ENDOMECHANICALS) ×2 IMPLANT
SCISSORS LAP 5X35 DISP (ENDOMECHANICALS) ×2 IMPLANT
SET TUBE SMOKE EVAC HIGH FLOW (TUBING) ×2 IMPLANT
SHEARS HARMONIC ACE PLUS 36CM (ENDOMECHANICALS) IMPLANT
SLEEVE XCEL OPT CAN 5 100 (ENDOMECHANICALS) ×2 IMPLANT
SPONGE GAUZE 2X2 STER 10/PKG (GAUZE/BANDAGES/DRESSINGS) ×1
SUT MNCRL AB 4-0 PS2 18 (SUTURE) ×2 IMPLANT
SUT PDS AB 0 CT1 36 (SUTURE) IMPLANT
SUT PDS AB 1 CT1 27 (SUTURE) IMPLANT
SUT SILK 2 0 SH (SUTURE) IMPLANT
TOWEL OR 17X26 10 PK STRL BLUE (TOWEL DISPOSABLE) ×2 IMPLANT
TRAY FOLEY MTR SLVR 16FR STAT (SET/KITS/TRAYS/PACK) IMPLANT
TRAY LAPAROSCOPIC (CUSTOM PROCEDURE TRAY) ×2 IMPLANT
TROCAR BLADELESS OPT 5 100 (ENDOMECHANICALS) ×2 IMPLANT
TROCAR XCEL 12X100 BLDLESS (ENDOMECHANICALS) ×2 IMPLANT

## 2018-09-13 NOTE — H&P (Signed)
Sharon SimmondsLauren N Simon  10-12-1990 [id number]  CARE TEAM:  PCP: Marcelle OverlieGrewal, Michelle, MD  Outpatient Care Team: Patient Care Team: Marcelle OverlieGrewal, Michelle, MD as PCP - General (Obstetrics and Gynecology)  Inpatient Treatment Team: Treatment Team: Attending Provider: Laurence SpatesLittle, Rachel Morgan, MD; Registered Nurse: Debby FreibergWedderburn, Ngozi N, RN; Technician: Louellen MolderLowdermilk, Jessica N, NT; Technician: Reva BoresLittle, Shirlene M, NT; Consulting Physician: Montez Moritacs, Md, MD   This patient is a 28 y.o.female who presents today for surgical evaluation at the request of Dr Clarene DukeLittle.   Chief complaint / Reason for evaluation: Appendicitis  Young healthy woman who awoke in the middle night with crampy abdominal pain.  Periumbilical.  Gradually intensified through the day.  Decreased appetite.  Nauseated.  Did not progress to vomiting. Did not improve with pain medications or antiacid medications.  Based on concerns brought to emergency room.  No sick contacts or travel.  No history of inflammatory bowel disease or irritable bowel syndrome.  No vaginal bleeding or discharge.  No history of STDs or PID.  No urinary tract infections.  No hematuria or dysuria or pyuria.  No history of kidney stones.  CT scan shows evidence of probable early appendicitis with fecalith.  Surgical consultation requested.  No personal nor family history of GI/colon cancer, inflammatory bowel disease, irritable bowel syndrome, allergy such as Celiac Sprue, dietary/dairy problems, colitis, ulcers nor gastritis.  No recent sick contacts/gastroenteritis.  No travel outside the country.  No changes in diet.  No dysphagia to solids or liquids.  No significant heartburn or reflux.  No hematochezia, hematemesis, coffee ground emesis.  No evidence of prior gastric/peptic ulceration.  He does not smoke.  Normally can walk several miles not any difficulty.  Symptoms of better control but did require IV pain and nausea medications.   Assessment  Sharon SimmondsLauren N Simon  28 y.o. female   Day of Surgery  Procedure(s): APPENDECTOMY LAPAROSCOPIC  Problem List:  Principal Problem:   Acute appendicitis   History physical and CT scan suspicious for early appendicitis.  The rest of the differential diagnosis is unremarkable   Plan:  Diagnostic laparoscopy with appendectomy.  The anatomy & physiology of the digestive tract was discussed.  The pathophysiology of appendicitis and other appendiceal disorders were discussed.  Natural history risks without surgery was discussed.   I feel the risks of no intervention will lead to serious problems that outweigh the operative risks; therefore, I recommended diagnostic laparoscopy with removal of appendix to remove the pathology.  Laparoscopic & open techniques were discussed.   I noted a good likelihood this will help address the problem.    Risks such as bleeding, infection, abscess, leak, reoperation, injury to other organs, need for repair of tissues / organs, possible ostomy, hernia, heart attack, stroke, death, and other risks were discussed.  Goals of post-operative recovery were discussed as well.  We will work to minimize complications.  Questions were answered.  The patient expresses understanding & wishes to proceed with surgery.   IV fluids.  IV antibiotics.  Nausea control.  Pain control.  -VTE prophylaxis- SCDs, etc -mobilize as tolerated to help recovery  25 minutes spent in review, evaluation, examination, counseling, and coordination of care.  More than 50% of that time was spent in counseling.  Ardeth SportsmanSteven C. Jasmina Gendron, MD, FACS, MASCRS Gastrointestinal and Minimally Invasive Surgery    [address] [place], [place] [id number] [phone] Main / Paging [phone] Fax   09/13/2018      Past Medical  History:  Diagnosis Date  . Heart murmur   . Mononucleosis   . Orbital floor fracture (HCC) 03/17/2012    Past Surgical History:  Procedure Laterality Date  . INNER EAR SURGERY    .  TONSILLECTOMY  06/2014    Social History   Socioeconomic History  . Marital status: Married    Spouse name: Not on file  . Number of children: Not on file  . Years of education: Not on file  . Highest education level: Not on file  Occupational History  . Not on file  Social Needs  . Financial resource strain: Not on file  . Food insecurity:    Worry: Not on file    Inability: Not on file  . Transportation needs:    Medical: Not on file    Non-medical: Not on file  Tobacco Use  . Smoking status: Never Smoker  . Smokeless tobacco: Never Used  Substance and Sexual Activity  . Alcohol use: No  . Drug use: No  . Sexual activity: Yes    Birth control/protection: Pill  Lifestyle  . Physical activity:    Days per week: Not on file    Minutes per session: Not on file  . Stress: Not on file  Relationships  . Social connections:    Talks on phone: Not on file    Gets together: Not on file    Attends religious service: Not on file    Active member of club or organization: Not on file    Attends meetings of clubs or organizations: Not on file    Relationship status: Not on file  . Intimate partner violence:    Fear of current or ex partner: Not on file    Emotionally abused: Not on file    Physically abused: Not on file    Forced sexual activity: Not on file  Other Topics Concern  . Not on file  Social History Narrative  . Not on file    History reviewed. No pertinent family history.  Current Facility-Administered Medications  Medication Dose Route Frequency Provider Last Rate Last Dose  . 0.9 %  sodium chloride infusion   Intravenous Continuous Lorre Nick, MD 125 mL/hr at 09/13/18 1447    . metroNIDAZOLE (FLAGYL) IVPB 500 mg  500 mg Intravenous Once Little, Ambrose Finland, MD 100 mL/hr at 09/13/18 1805 500 mg at 09/13/18 1805  . sodium chloride (PF) 0.9 % injection            Current Outpatient Medications  Medication Sig Dispense Refill  . BLACK ELDERBERRY PO  Take 2 tablets by mouth daily.    Marland Kitchen MONO-LINYAH 0.25-35 MG-MCG tablet Take 1 tablet by mouth every evening.   1  . methylPREDNISolone (MEDROL) 4 MG tablet 6 day taper to be taken as directed (Patient not taking: Reported on 04/23/2017) 21 tablet 0  . valACYclovir (VALTREX) 1000 MG tablet Take 500 mg by mouth daily as needed (cold sores).   0     No Known Allergies  ROS:   All other systems reviewed & are negative except per HPI or as noted below: Constitutional:  No fevers, chills, sweats.  Weight stable Eyes:  No vision changes, No discharge HENT:  No sore throats, nasal drainage Lymph: No neck swelling, No bruising easily Pulmonary:  No cough, productive sputum CV: No orthopnea, PND  Patient walks 60 minutes for about 2 miles without difficulty.  No exertional chest/neck/shoulder/arm pain. GI: No personal nor family history of GI/colon  cancer, inflammatory bowel disease, irritable bowel syndrome, allergy such as Celiac Sprue, dietary/dairy problems, colitis, ulcers nor gastritis.  No recent sick contacts/gastroenteritis.  No travel outside the country.  No changes in diet. Renal: No UTIs, No hematuria Genital:  No drainage, bleeding, masses Musculoskeletal: No severe joint pain.  Good ROM major joints Skin:  No sores or lesions.  No rashes Heme/Lymph:  No easy bleeding.  No swollen lymph nodes Neuro: No focal weakness/numbness.  No seizures Psych: No suicidal ideation.  No hallucinations  BP 115/73 (BP Location: Right Arm)   Pulse 60   Temp 97.7 F (36.5 C) (Oral)   Resp 15   LMP 08/02/2018   SpO2 100%   Physical Exam: General: Pt awake/alert/oriented x4 in no major acute distress Eyes: PERRL, normal EOM. Sclera nonicteric Neuro: CN II-XII intact w/o focal sensory/motor deficits. Lymph: No head/neck/groin lymphadenopathy Psych:  No delerium/psychosis/paranoia HENT: Normocephalic, Mucus membranes moist.  No thrush Neck: Supple, No tracheal deviation Chest: No pain.  Good  respiratory excursion. CV:  Pulses intact.  Regular rhythm Abdomen: Soft, Nondistended.  Mild tenderness in lower abdomen.  No obvious guarding.  Upper abdomen nontender.  Mild diastases recti.   Gen:  No inguinal hernias.  No inguinal lymphadenopathy.  No obvious vaginal bleeding or discharge Ext:  SCDs BLE.  No significant edema.  No cyanosis Skin: No petechiae / purpurea.  No major sores Musculoskeletal: No severe joint pain.  Good ROM major joints   Results:   Labs: Results for orders placed or performed during the hospital encounter of 09/13/18 (from the past 48 hour(s))  CBC with Differential/Platelet     Status: Abnormal   Collection Time: 09/13/18  2:32 PM  Result Value Ref Range   WBC 9.9 4.0 - 10.5 K/uL   RBC 4.13 3.87 - 5.11 MIL/uL   Hemoglobin 12.9 12.0 - 15.0 g/dL   HCT 16.1 09.6 - 04.5 %   MCV 93.7 80.0 - 100.0 fL   MCH 31.2 26.0 - 34.0 pg   MCHC 33.3 30.0 - 36.0 g/dL   RDW 40.9 81.1 - 91.4 %   Platelets 240 150 - 400 K/uL   nRBC 0.0 0.0 - 0.2 %   Neutrophils Relative % 78 %   Neutro Abs 7.8 (H) 1.7 - 7.7 K/uL   Lymphocytes Relative 16 %   Lymphs Abs 1.5 0.7 - 4.0 K/uL   Monocytes Relative 5 %   Monocytes Absolute 0.5 0.1 - 1.0 K/uL   Eosinophils Relative 0 %   Eosinophils Absolute 0.0 0.0 - 0.5 K/uL   Basophils Relative 1 %   Basophils Absolute 0.1 0.0 - 0.1 K/uL   Immature Granulocytes 0 %   Abs Immature Granulocytes 0.02 0.00 - 0.07 K/uL    Comment: Performed at [facility], [address], [place], [place] [address]  Comprehensive metabolic panel     Status: Abnormal   Collection Time: 09/13/18  2:32 PM  Result Value Ref Range   Sodium 134 (L) 135 - 145 mmol/L   Potassium 3.8 3.5 - 5.1 mmol/L   Chloride 105 98 - 111 mmol/L   CO2 19 (L) 22 - 32 mmol/L   Glucose, Bld 124 (H) 70 - 99 mg/dL   BUN 12 6 - 20 mg/dL   Creatinine, Ser 6.21 0.44 - 1.00 mg/dL   Calcium 8.8 (L) 8.9 - 10.3 mg/dL   Total Protein 7.1 6.5 - 8.1 g/dL   Albumin  4.0 3.5 - 5.0 g/dL  AST 21 15 - 41 U/L   ALT 12 0 - 44 U/L   Alkaline Phosphatase 44 38 - 126 U/L   Total Bilirubin 0.4 0.3 - 1.2 mg/dL   GFR calc non Af Amer >60 >60 mL/min   GFR calc Af Amer >60 >60 mL/min   Anion gap 10 5 - 15    Comment: Performed at [facility], [address] [place], [place] [address]  Lipase, blood     Status: None   Collection Time: 09/13/18  2:32 PM  Result Value Ref Range   Lipase 27 11 - 51 U/L    Comment: Performed at [facility], [address], [place], [place] [address]  I-Stat beta hCG blood, ED ([facility], [facility], [facility] only)     Status: None   Collection Time: 09/13/18  3:04 PM  Result Value Ref Range   I-stat hCG, quantitative <5.0 <5 mIU/mL   Comment 3            Comment:   GEST. AGE      CONC.  (mIU/mL)   <=1 WEEK        5 - 50     2 WEEKS       50 - 500     3 WEEKS       100 - 10,000     4 WEEKS     1,000 - 30,000        FEMALE AND NON-PREGNANT FEMALE:     LESS THAN 5 mIU/mL   Urinalysis, Routine w reflex microscopic     Status: Abnormal   Collection Time: 09/13/18  3:40 PM  Result Value Ref Range   Color, Urine STRAW (A) YELLOW   APPearance CLEAR CLEAR   Specific Gravity, Urine >1.046 (H) 1.005 - 1.030   pH 5.0 5.0 - 8.0   Glucose, UA NEGATIVE NEGATIVE mg/dL   Hgb urine dipstick NEGATIVE NEGATIVE   Bilirubin Urine NEGATIVE NEGATIVE   Ketones, ur 80 (A) NEGATIVE mg/dL   Protein, ur NEGATIVE NEGATIVE mg/dL   Nitrite NEGATIVE NEGATIVE   Leukocytes,Ua NEGATIVE NEGATIVE    Comment: Performed at [facility], [address], [place], [place] [address]    Imaging / Studies: Ct Abdomen Pelvis W Contrast  Result Date: 09/13/2018 CLINICAL DATA:  Epigastric pain that started this morning EXAM: CT ABDOMEN AND PELVIS WITH CONTRAST TECHNIQUE: Multidetector CT imaging of the abdomen and pelvis was performed using the standard protocol following bolus administration of intravenous contrast. CONTRAST:   OMNIPAQUE IOHEXOL 300 MG/ML  SOLN COMPARISON:  None. FINDINGS: Lower chest:  No contributory findings. Hepatobiliary: No focal liver abnormality.No evidence of biliary obstruction or stone. Pancreas: Unremarkable. Spleen: Unremarkable. Adrenals/Urinary Tract: Negative adrenals. No hydronephrosis or stone. Small volume gas in the urinary bladder. Stomach/Bowel: The appendix extends medially from the cecal base and is thickened by low-density fluid to 12 mm outer wall diameter. An appendicolith is present at the base of the appendix. Mucosal enhancement is poor at the level of the appendix, which limits detection of perforation. On coronal reformats along the inferior wall at the base there does appear to be fluid that is indistinguishable from the wall. High-density at the terminal ileum, likely ingested medication. Vascular/Lymphatic: No acute vascular abnormality. Prominent right colic vein without evidence of underlying cause. No mass or adenopathy. Reproductive:Negative Other: Trace pelvic fluid that is considered reactive. Musculoskeletal: No acute abnormalities. Currently attempting call report. IMPRESSION: 1. Acute appendicitis with appendicolith.  Detection of perforation is limited by poor mucosal enhancement and is not excluded at the appendiceal base. 2. Small volume gas in the urinary bladder, please correlate with urinalysis. Electronically Signed   By: Marnee Spring M.D.   On: 09/13/2018 16:50    Medications / Allergies: per chart  Antibiotics: Anti-infectives (From admission, onward)   Start     Dose/Rate Route Frequency Ordered Stop   09/13/18 1730  cefTRIAXone (ROCEPHIN) 2 g in sodium chloride 0.9 % 100 mL IVPB     2 g 200 mL/hr over 30 Minutes Intravenous  Once 09/13/18 1720 09/13/18 1759   09/13/18 1730  metroNIDAZOLE (FLAGYL) IVPB 500 mg     500 mg 100 mL/hr over 60 Minutes Intravenous  Once 09/13/18 1720          Note: Portions of this report may have been transcribed  using voice recognition software. Every effort was made to ensure accuracy; however, inadvertent computerized transcription errors may be present.   Any transcriptional errors that result from this process are unintentional.    [name], MD, FACS, MASCRS Gastrointestinal and Minimally Invasive Surgery    [address], [address] [place], [place] [address] [phone] Main / Paging [phone] Fax   09/13/2018

## 2018-09-13 NOTE — Interval H&P Note (Signed)
History and Physical Interval Note:  09/13/2018 7:02 PM  Sharon Simon  has presented today for surgery, with the diagnosis of appendicitis.  The various methods of treatment have been discussed with the patient and family. After consideration of risks, benefits and other options for treatment, the patient has consented to  Procedure(s): APPENDECTOMY LAPAROSCOPIC (N/A) as a surgical intervention.  The patient's history has been reviewed, patient examined, no change in status, stable for surgery.  I have reviewed the patient's chart and labs.  Questions were answered to the patient's satisfaction.     [name]

## 2018-09-13 NOTE — ED Provider Notes (Signed)
[facility]-EMERGENCY DEPT Provider Note   CSN: [id number] Arrival date & time: 09/13/18  1416    History   Chief Complaint Chief Complaint  Patient presents with  . Abdominal Pain    HPI Susa SimmondsLauren N Coale is a 28 y.o. female.     28 year old female presents with periumbilical pain which began earlier this morning.  Has had nausea but no vomiting.  No fever or chills.  No vaginal bleeding or discharge.  Denies any urinary symptoms.  Pain is been persistent and unrelieved by tramadol and antacids.  No prior history of same.  Nothing makes her symptoms better     Past Medical History:  Diagnosis Date  . Heart murmur   . Mononucleosis     There are no active problems to display for this patient.   Past Surgical History:  Procedure Laterality Date  . INNER EAR SURGERY    . TONSILLECTOMY  06/2014     OB History   No obstetric history on file.      Home Medications    Prior to Admission medications   Medication Sig Start Date End Date Taking? Authorizing Provider  methylPREDNISolone (MEDROL) 4 MG tablet 6 day taper to be taken as directed Patient not taking: Reported on 04/23/2017 04/03/17   Naida Sleightwens, James M, PA-C  [facility].25[facility]-MCG tablet 1 tablet. 05/11/14   [provider]  valACYclovir (VALTREX) 1000 MG tablet Take 500 mg by mouth daily. 04/07/14   [provider]    Family History History reviewed. No pertinent family history.  Social History Social History   Tobacco Use  . Smoking status: Never Smoker  . Smokeless tobacco: Never Used  Substance Use Topics  . Alcohol use: No  . Drug use: No     Allergies   Patient has no known allergies.   Review of Systems Review of Systems  All other systems reviewed and are negative.    Physical Exam Updated Vital Signs BP 120/69 (BP Location: Right Arm)   Pulse (!) 58   Temp 97.7 F (36.5 C) (Oral)   Resp 18   LMP 08/23/2018   SpO2 100%   Physical  Exam Vitals signs and nursing note reviewed.  Constitutional:      General: She is not in acute distress.    Appearance: Normal appearance. She is well-developed. She is not toxic-appearing.  HENT:     Head: Normocephalic and atraumatic.  Eyes:     General: Lids are normal.     Conjunctiva/sclera: Conjunctivae normal.     Pupils: Pupils are equal, round, and reactive to light.  Neck:     Musculoskeletal: Normal range of motion and neck supple.     Thyroid: No thyroid mass.     Trachea: No tracheal deviation.  Cardiovascular:     Rate and Rhythm: Normal rate and regular rhythm.     Heart sounds: Normal heart sounds. No murmur. No gallop.   Pulmonary:     Effort: Pulmonary effort is normal. No respiratory distress.     Breath sounds: Normal breath sounds. No stridor. No decreased breath sounds, wheezing, rhonchi or rales.  Abdominal:     General: Bowel sounds are normal. There is no distension.     Palpations: Abdomen is soft.     Tenderness: There is abdominal tenderness in the periumbilical area. There is no rebound.    Musculoskeletal: Normal range of motion.        General: No tenderness.  Skin:  General: Skin is warm and dry.     Findings: No abrasion or rash.  Neurological:     Mental Status: She is alert and oriented to person, place, and time.     GCS: GCS eye subscore is 4. GCS verbal subscore is 5. GCS motor subscore is 6.     Cranial Nerves: No cranial nerve deficit.     Sensory: No sensory deficit.  Psychiatric:        Speech: Speech normal.        Behavior: Behavior normal.      ED Treatments / Results  Labs (all labs ordered are listed, but only abnormal results are displayed) Labs Reviewed  CBC WITH DIFFERENTIAL/PLATELET  COMPREHENSIVE METABOLIC PANEL  LIPASE, BLOOD  I-STAT BETA HCG BLOOD, ED ([facility], [facility], [facility] ONLY)    EKG None  Radiology No results found.  Procedures Procedures (including critical care time)  Medications Ordered in ED  Medications  0.9 %  sodium chloride infusion (has no administration in time range)  morphine 4 MG/ML injection 4 mg (has no administration in time range)  ondansetron (ZOFRAN) injection 4 mg (has no administration in time range)     Initial Impression / Assessment and Plan / ED Course  I have reviewed the triage vital signs and the nursing notes.  Pertinent labs & imaging results that were available during my care of the patient were reviewed by me and considered in my medical decision making (see chart for details).        Pt medicated for pain here and given IV fluids.  Given antiemetics.  Abdominal CT ordered and is pending at this time.  Will be followed by Dr. Clarene Duke  Final Clinical Impressions(s) / ED Diagnoses   Final diagnoses:  None    ED Discharge Orders    None       Lorre Nick, MD 09/13/18 1539

## 2018-09-13 NOTE — Anesthesia Procedure Notes (Signed)
Procedure Name: Intubation Date/Time: 09/13/2018 7:07 PM Performed by: Deauna Yaw D, CRNA Pre-anesthesia Checklist: Patient identified, Emergency Drugs available, Suction available and Patient being monitored Patient Re-evaluated:Patient Re-evaluated prior to induction Oxygen Delivery Method: Circle system utilized Preoxygenation: Pre-oxygenation with 100% oxygen Induction Type: IV induction and Rapid sequence Laryngoscope Size: Mac and 3 Grade View: Grade I Tube type: Oral Tube size: 7.0 mm Number of attempts: 1 Airway Equipment and Method: Stylet Placement Confirmation: ETT inserted through vocal cords under direct vision,  positive ETCO2 and breath sounds checked- equal and bilateral Secured at: 21 cm Tube secured with: Tape Dental Injury: Teeth and Oropharynx as per pre-operative assessment

## 2018-09-13 NOTE — ED Notes (Signed)
Bed: WA21 Expected date:  Expected time:  Means of arrival:  Comments: 28 yo abd pain 

## 2018-09-13 NOTE — ED Notes (Signed)
PATIENT BELONGINGS ARE IN LOCKER 26 IN [place]. THIS RN MADE OR STAFF AWARE OF WHERE PATIENT BELONGINGS ARE PLACED.

## 2018-09-13 NOTE — ED Triage Notes (Signed)
Patient arrived by EMS from home. Pt c/o epigastric pain that started this AM.   Pt took Zofran and two tramadol w/o any relief.   VS WDL per EMS.

## 2018-09-13 NOTE — ED Notes (Addendum)
Patient was transported to surgery by ED staff Shanda Bumps NT w/ informed consent.

## 2018-09-13 NOTE — Op Note (Signed)
PATIENT:  Sharon SimmondsLauren N Beranek  28 y.o. female  Patient Care Team: Marcelle OverlieGrewal, Michelle, MD as PCP - General (Obstetrics and Gynecology)  PRE-OPERATIVE DIAGNOSIS:  Acute appendicitis with fecalith   POST-OPERATIVE DIAGNOSIS:   Acute appendicitis with fecalith   PROCEDURE:  LAPAROSCOPIC APPENDECTOMY WITH DIAGNOSTIC LAPAROSCOPY  SURGEON:  Ardeth SportsmanSteven C. Matalie Romberger, MD  ANESTHESIA:   local and general  EBL:  Total I/O In: 1000 [I.V.:1000] Out: 175 [Urine:100; Blood:75]  Delay start of Pharmacological VTE agent (>24hrs) due to surgical blood loss or risk of bleeding:  no  DRAINS: none   SPECIMEN:  APPENDIX  DISPOSITION OF SPECIMEN:  PATHOLOGY  COUNTS:  YES  PLAN OF CARE: Admit for overnight observation  PATIENT DISPOSITION:  PACU - hemodynamically stable.   INDICATIONS: Patient with concerning symptoms & work up suspicious for appendicitis.  Surgery was recommended:  The anatomy & physiology of the digestive tract was discussed.  The pathophysiology of appendicitis was discussed.  Natural history risks without surgery was discussed.   I feel the risks of no intervention will lead to serious problems that outweigh the operative risks; therefore, I recommended diagnostic laparoscopy with removal of appendix to remove the pathology.  Laparoscopic & open techniques were discussed.   I noted a good likelihood this will help address the problem.    Risks such as bleeding, infection, abscess, leak, reoperation, possible ostomy, hernia, heart attack, death, and other risks were discussed.  Goals of post-operative recovery were discussed as well.  We will work to minimize complications.  Questions were answered.  The patient expresses understanding & wishes to proceed with surgery.  OR FINDINGS:   Dilated thickened appendix going down in the right pelvis consistent with early appendicitis.  No separation abscess or gangrene.  Fullness suspicious for fecalith in proximal appendix.  Normal adnexa.   Normal small bowel and colon and digestive tract.  Underwhelming abdomen outside of the early appendicitis   DESCRIPTION:   The patient was identified & brought into the operating room. The patient was positioned supine with arms tucked. SCDs were active during the entire case. The patient underwent general anesthesia without any difficulty.  The abdomen was prepped and draped in a sterile fashion. A Surgical Timeout confirmed our plan.  I made a transverse incision through the superior umbilical fold.  I made a small transverse nick through the infraumbilical fascia and confirmed peritoneal entry.  I placed a 5mm port.  We induced carbon dioxide insufflation.  Camera inspection revealed no injury.  I placed additional ports under direct laparoscopic visualization.  I could see the appendix going down to the pelvis.  Is able to bring it up.  No adhesions.  Distal two thirds the packets were thickened and distended with some inflammation.  Seem to be more of a firm smooth intraluminal mass in the proximal appendix suspicious for the fecalith that was suspected on CAT scan.  Based of the appendix was clear.  I mobilized the terminal ileum to proximal ascending colon in a lateral to medial fashion.  I took care to avoid injuring any retroperitoneal structures. I created a window at the base of the appendix off of the mesoappendix. I was able to free off the base of the appendix which was still viable.  I stapled the appendix off the cecum using a laparoscopic stapler. I took a healthy cuff of viable cecum. I ligated the mesoappendix and assured hemostasis in the mesentery.  Using a 0 PDS Endoloop and cautery.  I removed  the appendix inside the 12 mm port.  I did copious irrigation. Hemostasis was good in the mesoappendix, colon mesentery, and retroperitoneum. Staple line was intact on the cecum with no bleeding.  No evidence of any injury or other abnormality.  I did more formal diagnostic laparoscopy  given the only mildly inflamed appendix to make sure there were no other problems.  I was able to inspect the small bowel from the ileocecal valve proximally to the ligament of Treitz.  Thin decompressed small intestine.  No thickening suspicious for Crohn's.  No Meckel's diverticulum.  No other surprises.  Liver and gallbladder look normal in the right upper quadrant.  Stomach not and is distended nor inflamed.  No obvious peritonitis or perforated ulcer.  Colon from cecum to peritoneal reflection of mid rectum was normal not inflamed.  No evidence of diverticulitis or abscess.  Uterus was small without any fibroids.  Bilateral adnexa small with no ovarian cysts nor tubo-ovarian abscess.    I washed out the pelvis, retrohepatic space and right paracolic gutter. I washed out the left side as well.  Hemostasis is good. There was no perforation or injury. Because the area cleaned up well after irrigation, I did not place a drain.  I aspirated the carbon dioxide. I removed the ports.   I closed the 12 mm stapler port site with a 0 Vicryl stitch. I closed skin using 4-0 monocryl stitch.  Sterile dressings applied.  Patient was extubated and sent to the recovery room.  I discussed the operative findings with the patient's mother, Clydie Braun. I suspect the patient is going to ned to stay in the hospital tonight and will need antibiotics for overnight only.  Most likely can be discharged home if she meets goals.  Questions answered.  The mother expressed understanding and appreciation.  [name], M.D., F.A.C.S. Gastrointestinal and Minimally Invasive Surgery [facility] [address] [place], [place] [address] [phone] Main / Paging  09/13/2018 8:03 PM

## 2018-09-13 NOTE — Anesthesia Preprocedure Evaluation (Signed)
Anesthesia Evaluation  Patient identified by MRN, date of birth, ID band Patient awake    Reviewed: Allergy & Precautions, NPO status , Patient's Chart, lab work & pertinent test results  Airway Mallampati: I       Dental no notable dental hx. (+) Teeth Intact   Pulmonary neg pulmonary ROS,    Pulmonary exam normal breath sounds clear to auscultation       Cardiovascular negative cardio ROS Normal cardiovascular exam+ Valvular Problems/Murmurs  Rhythm:Regular Rate:Normal     Neuro/Psych negative neurological ROS  negative psych ROS   GI/Hepatic negative GI ROS, Neg liver ROS,   Endo/Other  negative endocrine ROS  Renal/GU negative Renal ROS  negative genitourinary   Musculoskeletal negative musculoskeletal ROS (+)   Abdominal Normal abdominal exam  (+)   Peds negative pediatric ROS (+)  Hematology negative hematology ROS (+)   Anesthesia Other Findings   Reproductive/Obstetrics negative OB ROS                             Anesthesia Physical Anesthesia Plan  ASA: I  Anesthesia Plan: General   Post-op Pain Management:    Induction: Intravenous  PONV Risk Score and Plan: Ondansetron, Dexamethasone and Scopolamine patch - Pre-op  Airway Management Planned: Oral ETT  Additional Equipment:   Intra-op Plan:   Post-operative Plan: Extubation in OR  Informed Consent: I have reviewed the patients History and Physical, chart, labs and discussed the procedure including the risks, benefits and alternatives for the proposed anesthesia with the patient or authorized representative who has indicated his/her understanding and acceptance.     Dental advisory given  Plan Discussed with: CRNA  Anesthesia Plan Comments:         Anesthesia Quick Evaluation

## 2018-09-13 NOTE — Anesthesia Postprocedure Evaluation (Signed)
Anesthesia Post Note  Patient: Sharon Simon  Procedure(s) Performed: APPENDECTOMY LAPAROSCOPIC (N/A Abdomen)     Patient location during evaluation: PACU Anesthesia Type: General Level of consciousness: awake and sedated Pain management: pain level controlled Vital Signs Assessment: post-procedure vital signs reviewed and stable Respiratory status: spontaneous breathing Cardiovascular status: stable Postop Assessment: no apparent nausea or vomiting Anesthetic complications: no    Last Vitals:  Vitals:   09/13/18 1425 09/13/18 1600  BP:  115/73  Pulse:  60  Resp:  15  Temp:    SpO2: 100% 100%    Last Pain:  Vitals:   09/13/18 1652  TempSrc:   PainSc: 6    Pain Goal:                   Caren Macadam

## 2018-09-13 NOTE — ED Notes (Signed)
Patient given 2 CHG wipes for pre surgical prep.

## 2018-09-13 NOTE — Transfer of Care (Signed)
Immediate Anesthesia Transfer of Care Note  Patient: Sharon Simon  Procedure(s) Performed: APPENDECTOMY LAPAROSCOPIC (N/A Abdomen)  Patient Location: PACU  Anesthesia Type:General  Level of Consciousness: awake, alert  and oriented  Airway & Oxygen Therapy: Patient Spontanous Breathing and Patient connected to face mask oxygen  Post-op Assessment: Report given to RN and Post -op Vital signs reviewed and stable  Post vital signs: Reviewed and stable  Last Vitals:  Vitals Value Taken Time  BP    Temp    Pulse    Resp    SpO2      Last Pain:  Vitals:   09/13/18 1652  TempSrc:   PainSc: 6          Complications: No apparent anesthesia complications

## 2018-09-13 NOTE — ED Provider Notes (Signed)
I received this patient in signout from Dr. Freida Busman. Briefly, she had presented with periumbilical abdominal pain around 3am today. Labwork reassuring, at time of signout pending CT of abd/pelvis.   Urinalysis shows ketones but no evidence of infection.  CT shows acute appendicitis with appendicolith.  No obvious evidence of perforation.  Patient remains well-appearing on reassessment.  Discussed findings and plan for admission with the patient.  Contacted general surgery and discussed with Dr. Michaell Cowing. Pt admitted for further care. Ordered CTX and flagyl.   Azzan Butler, Ambrose Finland, MD 09/13/18 [phone]

## 2018-09-14 ENCOUNTER — Encounter (HOSPITAL_COMMUNITY): Payer: Self-pay | Admitting: Surgery

## 2018-09-14 MED ORDER — OXYCODONE HCL 5 MG PO TABS: 5.0000 mg | ORAL_TABLET | ORAL | Status: DC | PRN

## 2018-09-14 NOTE — Discharge Instructions (Signed)
If you have any concerns or questions about your wound please take a picture and email it to [email or web address]  [facility]  LAPAROSCOPIC SURGERY: POST OP INSTRUCTIONS Always review your discharge instruction sheet given to you by the facility where your surgery was performed. IF YOU HAVE DISABILITY OR FAMILY LEAVE FORMS, YOU MUST BRING THEM TO THE OFFICE FOR PROCESSING.   DO NOT GIVE THEM TO YOUR DOCTOR.  PAIN CONTROL  1. First take acetaminophen (Tylenol) AND/or ibuprofen (Advil) to control your pain after surgery.  Follow directions on package.  Taking acetaminophen (Tylenol) and/or ibuprofen (Advil) regularly after surgery will help to control your pain and lower the amount of prescription pain medication you may need.  You should not take more than 4,000 mg (4 grams) of acetaminophen (Tylenol) in 24 hours.  You should not take ibuprofen (Advil), aleve, motrin, naprosyn or other NSAIDS if you have a history of stomach ulcers or chronic kidney disease.  2. A prescription for pain medication may be given to you upon discharge.  Take your pain medication as prescribed, if you still have uncontrolled pain after taking acetaminophen (Tylenol) or ibuprofen (Advil). 3. Use ice packs to help control pain. 4. If you need a refill on your pain medication, please contact your pharmacy.  They will contact our office to request authorization. Prescriptions will not be filled after 5pm or on week-ends.  HOME MEDICATIONS 5. Take your usually prescribed medications unless otherwise directed.  DIET 6. You should follow a light diet the first few days after arrival home.  Be sure to include lots of fluids daily. Avoid fatty, fried foods.   CONSTIPATION 7. It is common to experience some constipation after surgery and if you are taking pain medication.  Increasing fluid intake and taking a stool softener (such as Colace) will usually help or prevent this problem from  occurring.  A mild laxative (Milk of Magnesia or Miralax) should be taken according to package instructions if there are no bowel movements after 48 hours.  WOUND/INCISION CARE 8. Most patients will experience some swelling and bruising in the area of the incisions.  Ice packs will help.  Swelling and bruising can take several days to resolve.  9. Unless discharge instructions indicate otherwise, follow guidelines below  a. STERI-STRIPS - you may remove your outer bandages 48 hours after surgery, and you may shower at that time.  You have steri-strips (small skin tapes) in place directly over the incision.  These strips should be left on the skin for 7-10 days.   b. DERMABOND/SKIN GLUE - you may shower in 24 hours.  The glue will flake off over the next 2-3 weeks. 10. Any sutures or staples will be removed at the office during your follow-up visit.  ACTIVITIES 11. You may resume regular (light) daily activities beginning the next day--such as daily self-care, walking, climbing stairs--gradually increasing activities as tolerated.  You may have sexual intercourse when it is comfortable.  Refrain from any heavy lifting or straining until approved by your doctor. a. You may drive when you are no longer taking prescription pain medication, you can comfortably wear a seatbelt, and you can safely maneuver your car and apply brakes.  FOLLOW-UP 12. You should see your doctor in the office for a follow-up appointment approximately 2-3 weeks after your surgery.  You should have been given your post-op/follow-up appointment when your surgery was scheduled.  If you did not receive a post-op/follow-up appointment, make sure that you  call for this appointment within a day or two after you arrive home to insure a convenient appointment time.   WHEN TO CALL YOUR DOCTOR: 1. Fever over 101.0 2. Inability to urinate 3. Continued bleeding from incision. 4. Increased pain, redness, or drainage from the  incision. 5. Increasing abdominal pain  The clinic staff is available to answer your questions during regular business hours.  Please dont hesitate to call and ask to speak to one of the nurses for clinical concerns.  If you have a medical emergency, go to the nearest emergency room or call 911.  [facility] is always on call at the hospital. [address], [address], [place], [place]  [address] ? [address] [address], [place], [place]   [id number] [phone] ? [phone] ? FAX [phone]     Managing Your Pain After Surgery Without Opioids    Thank you for participating in our program to help patients manage their pain after surgery without opioids. This is part of our effort to provide you with the best care possible, without exposing you or your family to the risk that opioids pose.  What pain can I expect after surgery? You can expect to have some pain after surgery. This is normal. The pain is typically worse the day after surgery, and quickly begins to get better. Many studies have found that many patients are able to manage their pain after surgery with Over-the-Counter (OTC) medications such as Tylenol and Motrin. If you have a condition that does not allow you to take Tylenol or Motrin, notify your surgical team.  How will I manage my pain? The best strategy for controlling your pain after surgery is around the clock pain control with Tylenol (acetaminophen) and Motrin (ibuprofen or Advil). Alternating these medications with each other allows you to maximize your pain control. In addition to Tylenol and Motrin, you can use heating pads or ice packs on your incisions to help reduce your pain.  How will I alternate your regular strength over-the-counter pain medication? You will take a dose of pain medication every three hours. ; Start by taking 650 mg of Tylenol (2 pills of 325 mg) ; 3 hours later take 600 mg of Motrin (3 pills of 200  mg) ; 3 hours after taking the Motrin take 650 mg of Tylenol ; 3 hours after that take 600 mg of Motrin.   - 1 -  See example - if your first dose of Tylenol is at 12:00 PM   12:00 PM Tylenol 650 mg (2 pills of 325 mg)  3:00 PM Motrin 600 mg (3 pills of 200 mg)  6:00 PM Tylenol 650 mg (2 pills of 325 mg)  9:00 PM Motrin 600 mg (3 pills of 200 mg)  Continue alternating every 3 hours   We recommend that you follow this schedule around-the-clock for at least 3 days after surgery, or until you feel that it is no longer needed. Use the table on the last page of this handout to keep track of the medications you are taking. Important: Do not take more than 3000mg  of Tylenol or 3200mg  of Motrin in a 24-hour period. Do not take ibuprofen/Motrin if you have a history of bleeding stomach ulcers, severe kidney disease, &/or actively taking a blood thinner  What if I still have pain? If you have pain that is not controlled with the over-the-counter pain medications (Tylenol and Motrin or Advil) you might have what we call  breakthrough pain. You will receive a prescription for a small amount of an opioid pain medication such as Oxycodone, Tramadol, or Tylenol with Codeine. Use these opioid pills in the first 24 hours after surgery if you have breakthrough pain. Do not take more than 1 pill every 4-6 hours.  If you still have uncontrolled pain after using all opioid pills, don't hesitate to call our staff using the number provided. We will help make sure you are managing your pain in the best way possible, and if necessary, we can provide a prescription for additional pain medication.   Day 1    Time  Name of Medication Number of pills taken  Amount of Acetaminophen  Pain Level   Comments  AM PM       AM PM       AM PM       AM PM       AM PM       AM PM       AM PM       AM PM       Total Daily amount of Acetaminophen Do not take more than  3,000 mg per day      Day 2    Time   Name of Medication Number of pills taken  Amount of Acetaminophen  Pain Level   Comments  AM PM       AM PM       AM PM       AM PM       AM PM       AM PM       AM PM       AM PM       Total Daily amount of Acetaminophen Do not take more than  3,000 mg per day      Day 3    Time  Name of Medication Number of pills taken  Amount of Acetaminophen  Pain Level   Comments  AM PM       AM PM       AM PM       AM PM          AM PM       AM PM       AM PM       AM PM       Total Daily amount of Acetaminophen Do not take more than  3,000 mg per day      Day 4    Time  Name of Medication Number of pills taken  Amount of Acetaminophen  Pain Level   Comments  AM PM       AM PM       AM PM       AM PM       AM PM       AM PM       AM PM       AM PM       Total Daily amount of Acetaminophen Do not take more than  3,000 mg per day      Day 5    Time  Name of Medication Number of pills taken  Amount of Acetaminophen  Pain Level   Comments  AM PM       AM PM       AM PM       AM PM       AM PM  AM PM       AM PM       AM PM       Total Daily amount of Acetaminophen Do not take more than  3,000 mg per day       Day 6    Time  Name of Medication Number of pills taken  Amount of Acetaminophen  Pain Level  Comments  AM PM       AM PM       AM PM       AM PM       AM PM       AM PM       AM PM       AM PM       Total Daily amount of Acetaminophen Do not take more than  3,000 mg per day      Day 7    Time  Name of Medication Number of pills taken  Amount of Acetaminophen  Pain Level   Comments  AM PM       AM PM       AM PM       AM PM       AM PM       AM PM       AM PM       AM PM       Total Daily amount of Acetaminophen Do not take more than  3,000 mg per day        For additional information about how and where to safely dispose of unused opioid medications -  [email or web address]  Disclaimer: This document contains information and/or instructional materials adapted from [facility] for the typical patient with your condition. It does not replace medical advice from your health care provider because your experience may differ from that of the typical patient. Talk to your health care provider if you have any questions about this document, your condition or your treatment plan. Adapted from [facility]

## 2018-09-14 NOTE — Discharge Summary (Signed)
Physician Discharge Summary  Patient ID: Sharon Simon MRN: [id number] DOB/AGE: [birth year] 28 y.o.  Admit date: 09/13/2018 Discharge date: 09/14/2018  Admission Diagnoses:  Discharge Diagnoses:  Principal Problem:   Acute appendicitis s/p lap appendectomy 09/13/2018 Active Problems:   Fecalith of appendix   Discharged Condition: good  Hospital Course: 28 yo healthy female admitted with appendicitis. Underwent uncomplicated laparoscopic appendectomy for appendicitis. Doing well following am and will be discharged.   Consults: None  Significant Diagnostic Studies: radiology: CT scan: appendicitis  Treatments: surgery: lap appy   Discharge Exam: Blood pressure 124/71, pulse 83, temperature 98.6 F (37 C), temperature source Oral, resp. rate 16, height 5\' 6"  (1.676 m), weight 73.5 kg, last menstrual period 08/02/2018, SpO2 100 %. GI: soft approp tender incisions clean  Disposition:   Discharge Instructions    Call MD for:   Complete by:  As directed    FEVER > 101.5 F  (temperatures < 101.5 F are not significant)   Call MD for:  extreme fatigue   Complete by:  As directed    Call MD for:  persistant dizziness or light-headedness   Complete by:  As directed    Call MD for:  persistant nausea and vomiting   Complete by:  As directed    Call MD for:  redness, tenderness, or signs of infection (pain, swelling, redness, odor or green/yellow discharge around incision site)   Complete by:  As directed    Call MD for:  severe uncontrolled pain   Complete by:  As directed    Diet - low sodium heart healthy   Complete by:  As directed    Start with a bland diet such as soups, liquids, starchy foods, low fat foods, etc. the first few days at home. Gradually advance to a solid, low-fat, high fiber diet by the end of the first week at home.   Add a fiber supplement to your diet (Metamucil, etc) If you feel full, bloated, or constipated, stay on a full liquid or pureed/blenderized  diet for a few days until you feel better and are no longer constipated.   Discharge instructions   Complete by:  As directed    See Discharge Instructions If you are not getting better after two weeks or are noticing you are getting worse, contact our office [phone] for further advice.  We may need to adjust your medications, re-evaluate you in the office, send you to the emergency room, or see what other things we can do to help. The clinic staff is available to answer your questions during regular business hours (8:30am-5pm).  Please don't hesitate to call and ask to speak to one of our nurses for clinical concerns.    A surgeon from [facility] is always on call at the hospitals 24 hours/day If you have a medical emergency, go to the nearest emergency room or call 911.   Discharge wound care:   Complete by:  As directed    It is good for closed incisions and even open wounds to be washed every day.  Shower every day.  Short baths are fine.  Wash the incisions and wounds clean with soap & water.     You may leave closed incisions open to air if it is dry.   You may cover the incision with clean gauze & replace it after your daily shower for comfort.   If you have skin tapes (Steristrips) or skin glue (Dermabond) on your incision, leave them  in place.  They will fall off on their own like a scab.  You may trim any edges that curl up with clean scissors.    If you have skin staples, set up an appointment for them to be removed in the office in 10 days after surgery.  If you have a drain, wash around the skin exit site with soap & water and place a new dressing of gauze or band aid around the skin every day.  Keep the drain site clean & dry.   Driving Restrictions   Complete by:  As directed    You may drive when: - you are no longer taking narcotic prescription pain medication - you can comfortably wear a seatbelt - you can safely make sudden turns/stops without pain.    Increase activity slowly   Complete by:  As directed    Start light daily activities --- self-care, walking, climbing stairs- beginning the day after surgery.  Gradually increase activities as tolerated.  Control your pain to be active.  Stop when you are tired.  Ideally, walk several times a day, eventually an hour a day.   Most people are back to most day-to-day activities in a few weeks.  It takes 4-6 weeks to get back to unrestricted, intense activity. If you can walk 30 minutes without difficulty, it is safe to try more intense activity such as jogging, treadmill, bicycling, low-impact aerobics, swimming, etc. Save the most intensive and strenuous activity for last (Usually 4-8 weeks after surgery) such as sit-ups, heavy lifting, contact sports, etc.  Refrain from any intense heavy lifting or straining until you are off narcotics for pain control.  You will have off days, but things should improve week-by-week. DO NOT PUSH THROUGH PAIN.  Let pain be your guide: If it hurts to do something, don't do it.   Lifting restrictions   Complete by:  As directed    If you can walk 30 minutes without difficulty, it is safe to try more intense activity such as jogging, treadmill, bicycling, low-impact aerobics, swimming, etc. Save the most intensive and strenuous activity for last (Usually 4-8 weeks after surgery) such as sit-ups, heavy lifting, contact sports, etc.   Refrain from any intense heavy lifting or straining until you are off narcotics for pain control.  You will have off days, but things should improve week-by-week. DO NOT PUSH THROUGH PAIN.  Let pain be your guide: If it hurts to do something, don't do it.  Pain is your body warning you to avoid that activity for another week until the pain goes down.   May shower / Bathe   Complete by:  As directed    May walk up steps   Complete by:  As directed    Sexual Activity Restrictions   Complete by:  As directed    You may have sexual intercourse  when it is comfortable. If it hurts to do something, stop.      Follow-up Information    Cox, Burna MortimerAllen L, MD .   Specialty:  Family Medicine Contact information: [address] [place] [id number] [phone]           Signed: Emelia LoronMatthew Ondrea Dow 09/14/2018, 9:27 AM

## 2019-07-01 ENCOUNTER — Ambulatory Visit: Payer: BLUE CROSS/BLUE SHIELD

## 2019-07-12 ENCOUNTER — Ambulatory Visit: Payer: BLUE CROSS/BLUE SHIELD

## 2020-02-28 IMAGING — CT CT ABDOMEN AND PELVIS WITH CONTRAST
2 of 4 series · 15 of 46 positions shown, 17 images · IV contrast (ISOVUE)
Comparison: None.

CLINICAL DATA: Epigastric pain that started this morning

EXAM:
CT ABDOMEN AND PELVIS WITH CONTRAST
TECHNIQUE: Multidetector CT imaging of the abdomen and pelvis was performed
using the standard protocol following bolus administration of
intravenous contrast.
CONTRAST:  100mL OMNIPAQUE IOHEXOL 300 MG/ML  SOLN

[Series 2: axial st · axial · 0.61mm/px · z∈[-99,+311]mm · 12 of 92 slices shown, 14 images]
[im 5/92  soft-tissue]
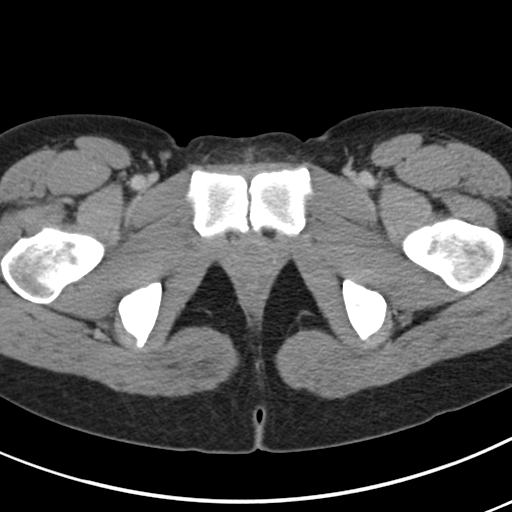
[im 5/92  bone]
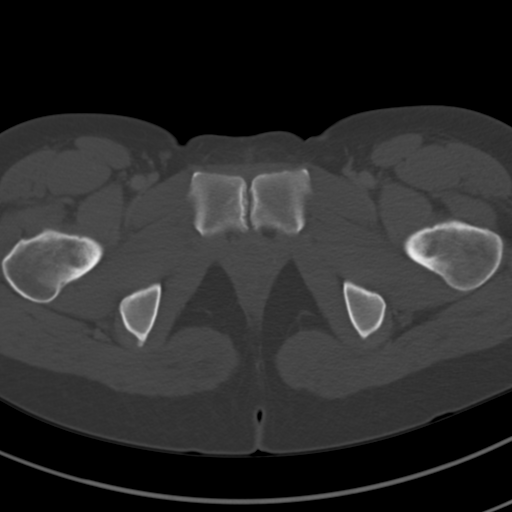
[im 14/92  soft-tissue]
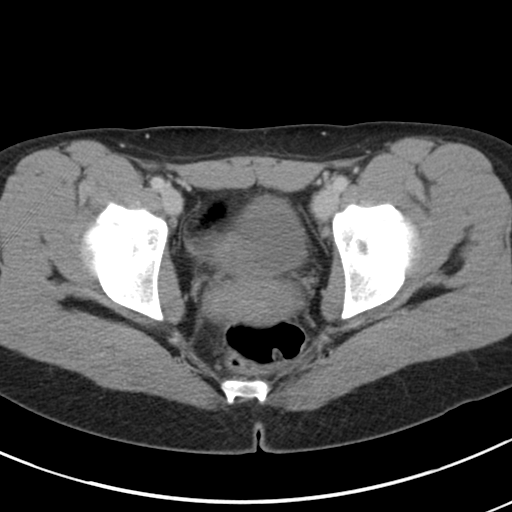
[im 22/92  soft-tissue]
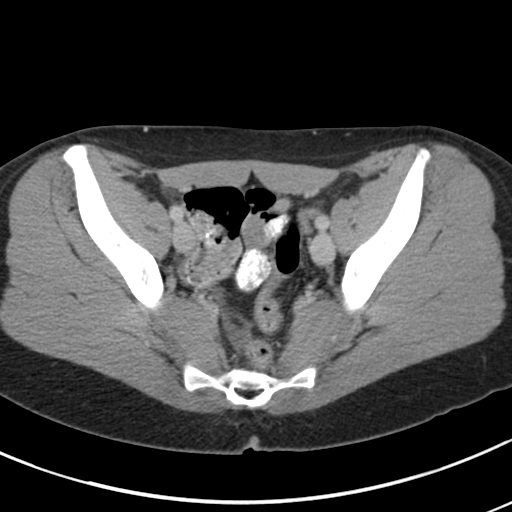
[im 27/92  soft-tissue]
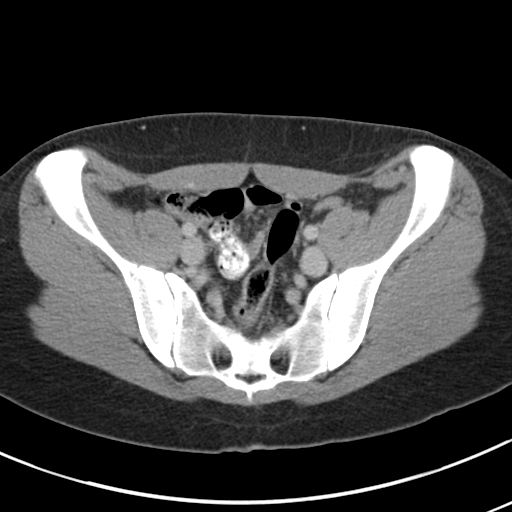
[im 35/92  soft-tissue]
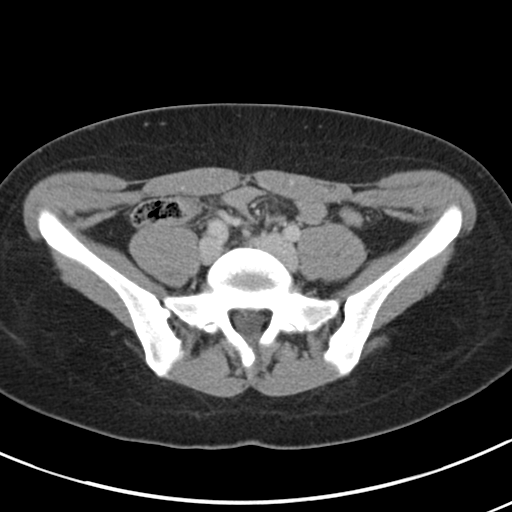
[im 44/92  soft-tissue]
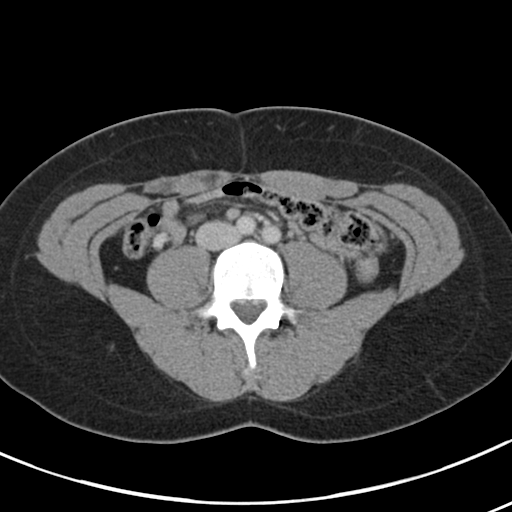
[im 48/92  soft-tissue]
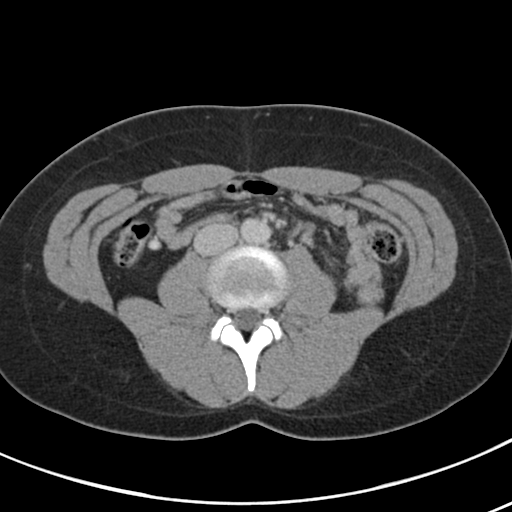
[im 57/92  soft-tissue]
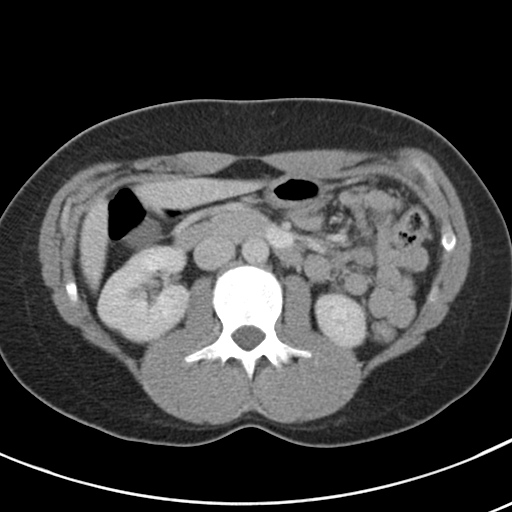
[im 66/92  soft-tissue]
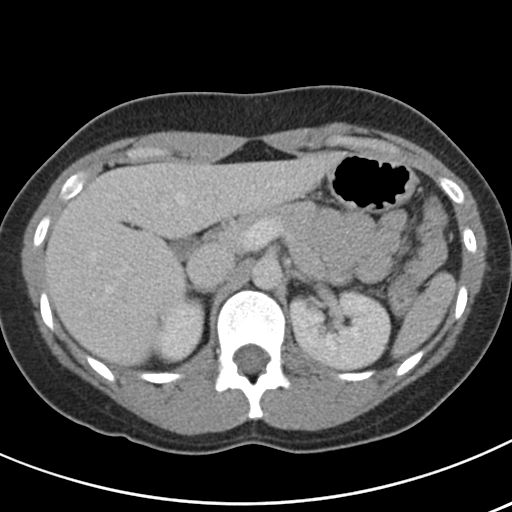
[im 66/92  bone]
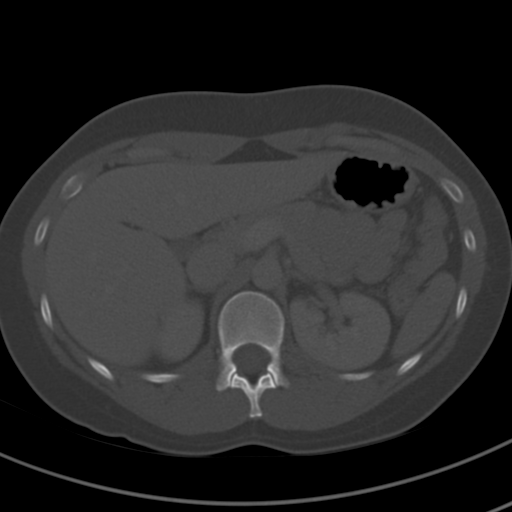
[im 70/92  soft-tissue]
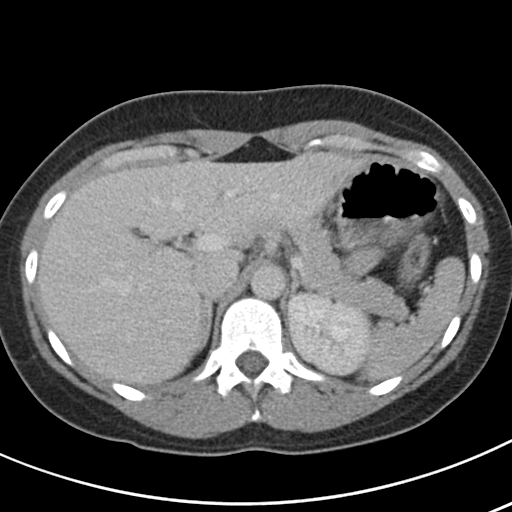
[im 79/92  soft-tissue]
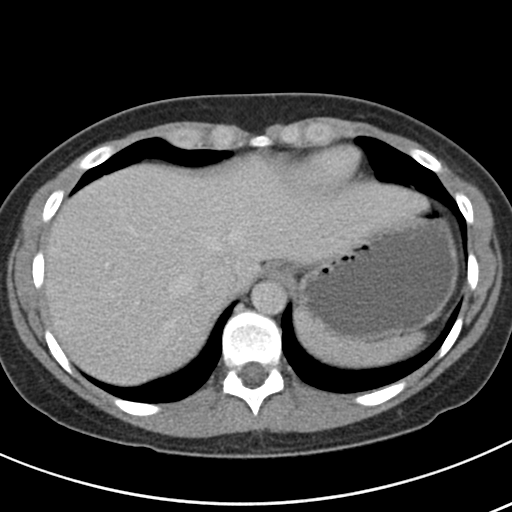
[im 87/92  soft-tissue]
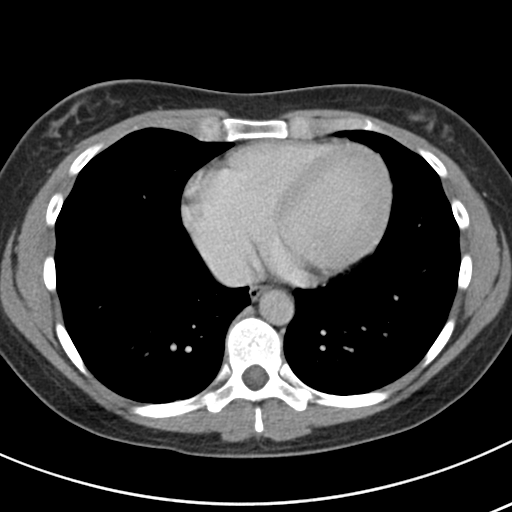

[Series 5: coronal st · coronal · 0.60mm/px · 3 of 95 slices shown]
[im 32/95  soft-tissue]
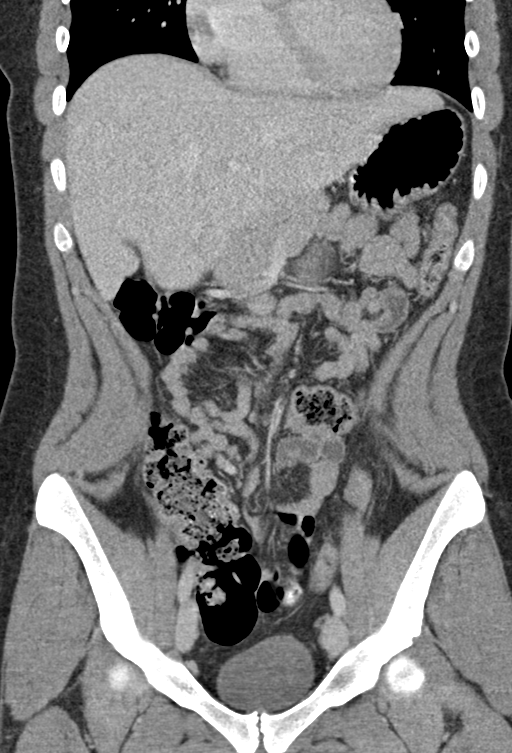
[im 42/95  soft-tissue]
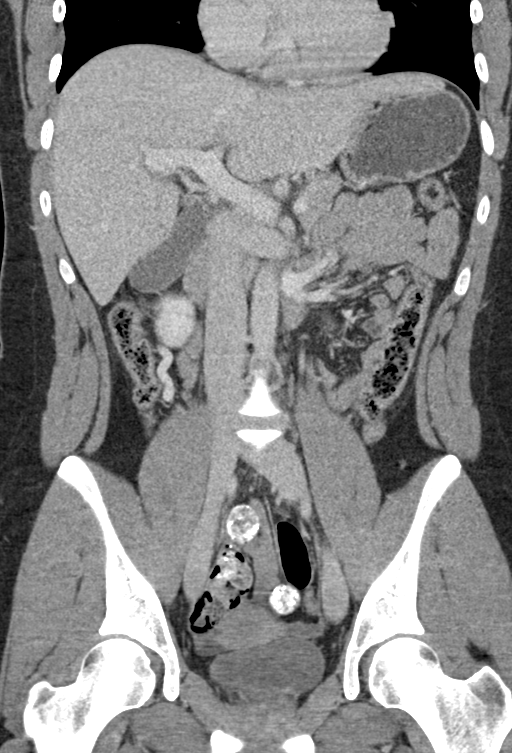
[im 53/95  soft-tissue]
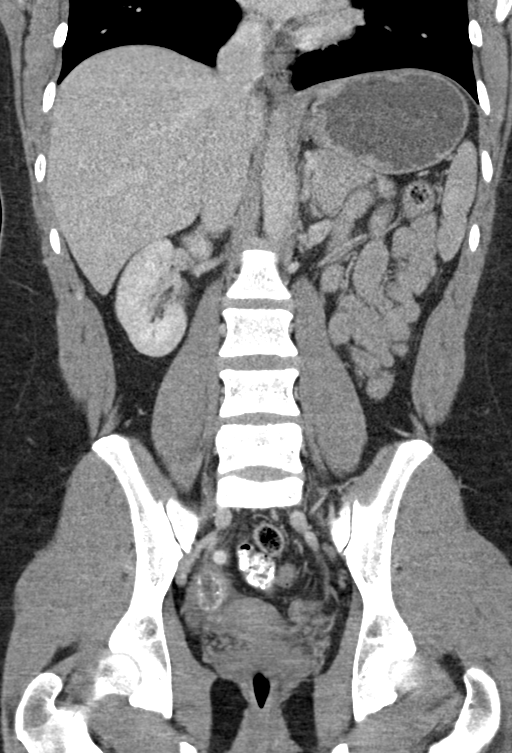

[15 of 46 positions shown; findings below may reference images not displayed]

FINDINGS: Lower chest:  No contributory findings.

Hepatobiliary: No focal liver abnormality.No evidence of biliary
obstruction or stone.

Pancreas: Unremarkable.

Spleen: Unremarkable.

Adrenals/Urinary Tract: Negative adrenals. No hydronephrosis or
stone. Small volume gas in the urinary bladder.

Stomach/Bowel: The appendix extends medially from the cecal base and
is thickened by low-density fluid to 12 mm outer wall diameter. An
appendicolith is present at the base of the appendix. Mucosal
enhancement is poor at the level of the appendix, which limits
detection of perforation. On coronal reformats along the inferior
wall at the base there does appear to be fluid that is
indistinguishable from the wall. High-density at the terminal
ileum, likely ingested medication.

Vascular/Lymphatic: No acute vascular abnormality. Prominent right
colic vein without evidence of underlying cause. No mass or
adenopathy.

Reproductive:Negative

Other: Trace pelvic fluid that is considered reactive.

Musculoskeletal: No acute abnormalities.

Currently attempting call report.
IMPRESSION: 1. Acute appendicitis with appendicolith. Detection of perforation
is limited by poor mucosal enhancement and is not excluded at the
appendiceal base.
2. Small volume gas in the urinary bladder, please correlate with
urinalysis.
